# Patient Record
Sex: Male | Born: 1965 | Race: White | Hispanic: No | Marital: Married | Smoking: Never smoker
Health system: Southern US, Community
[De-identification: ages and names within clinical notes are randomized; demographics above are authoritative.]

## PROBLEM LIST (undated history)

## (undated) DIAGNOSIS — N200 Calculus of kidney: Secondary | ICD-10-CM

## (undated) DIAGNOSIS — M5410 Radiculopathy, site unspecified: Secondary | ICD-10-CM

## (undated) DIAGNOSIS — Z87442 Personal history of urinary calculi: Secondary | ICD-10-CM

## (undated) DIAGNOSIS — T8859XA Other complications of anesthesia, initial encounter: Secondary | ICD-10-CM

## (undated) DIAGNOSIS — R519 Headache, unspecified: Secondary | ICD-10-CM

## (undated) HISTORY — PX: LITHOTRIPSY: SUR834

## (undated) HISTORY — DX: Calculus of kidney: N20.0

---

## 2004-11-29 IMAGING — CR DG CHEST 2V
1 series · 2 of 2 positions shown · non-contrast
Comparison: none

REASON FOR EXAM: Chest pain
COMMENTS:

PROCEDURE:     DXR - DXR CHEST PA (OR AP) AND LATERAL  - [DATE]  [DATE]
RESULT:     Comparison is made to a prior study dated [DATE].
The lungs are clear. The cardiac silhouette and visualized bony skeleton are
unremarkable.

[Series 1: view not recorded · 0.17mm/px · 2 of 2 slices shown]
[im 1/2]
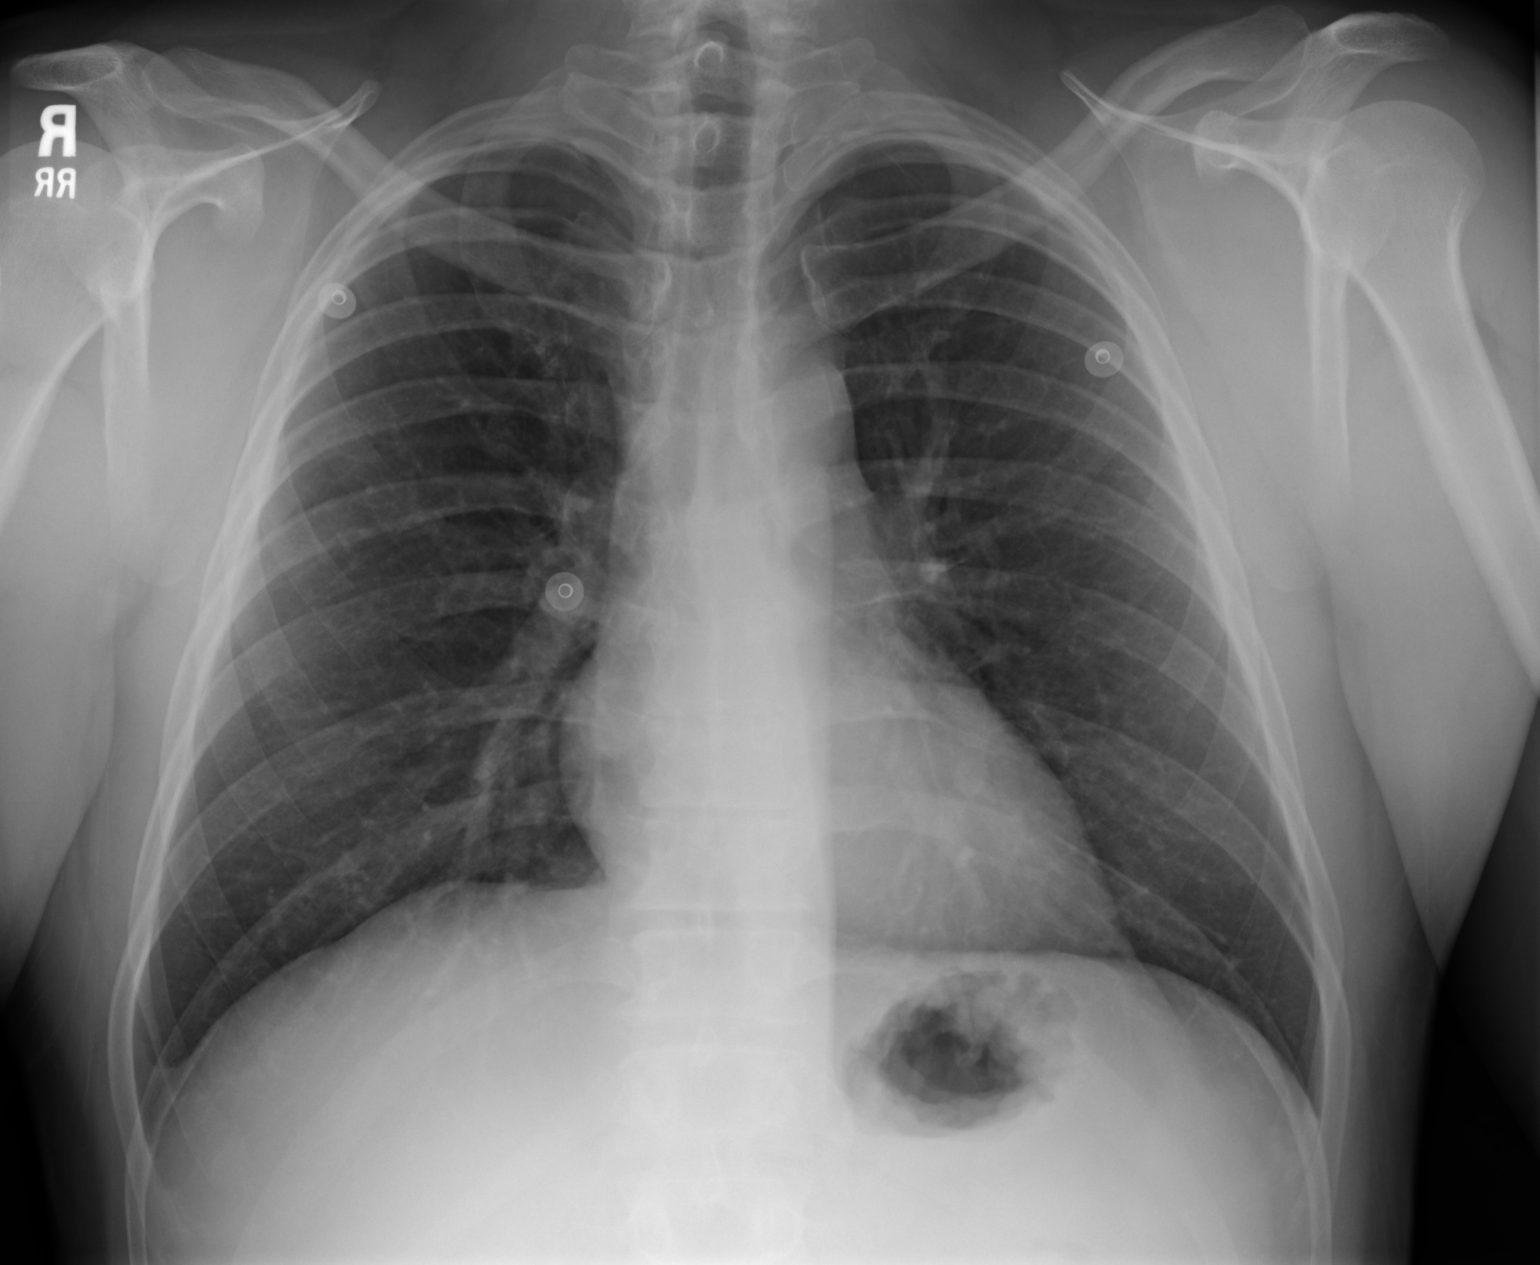
[im 2/2]
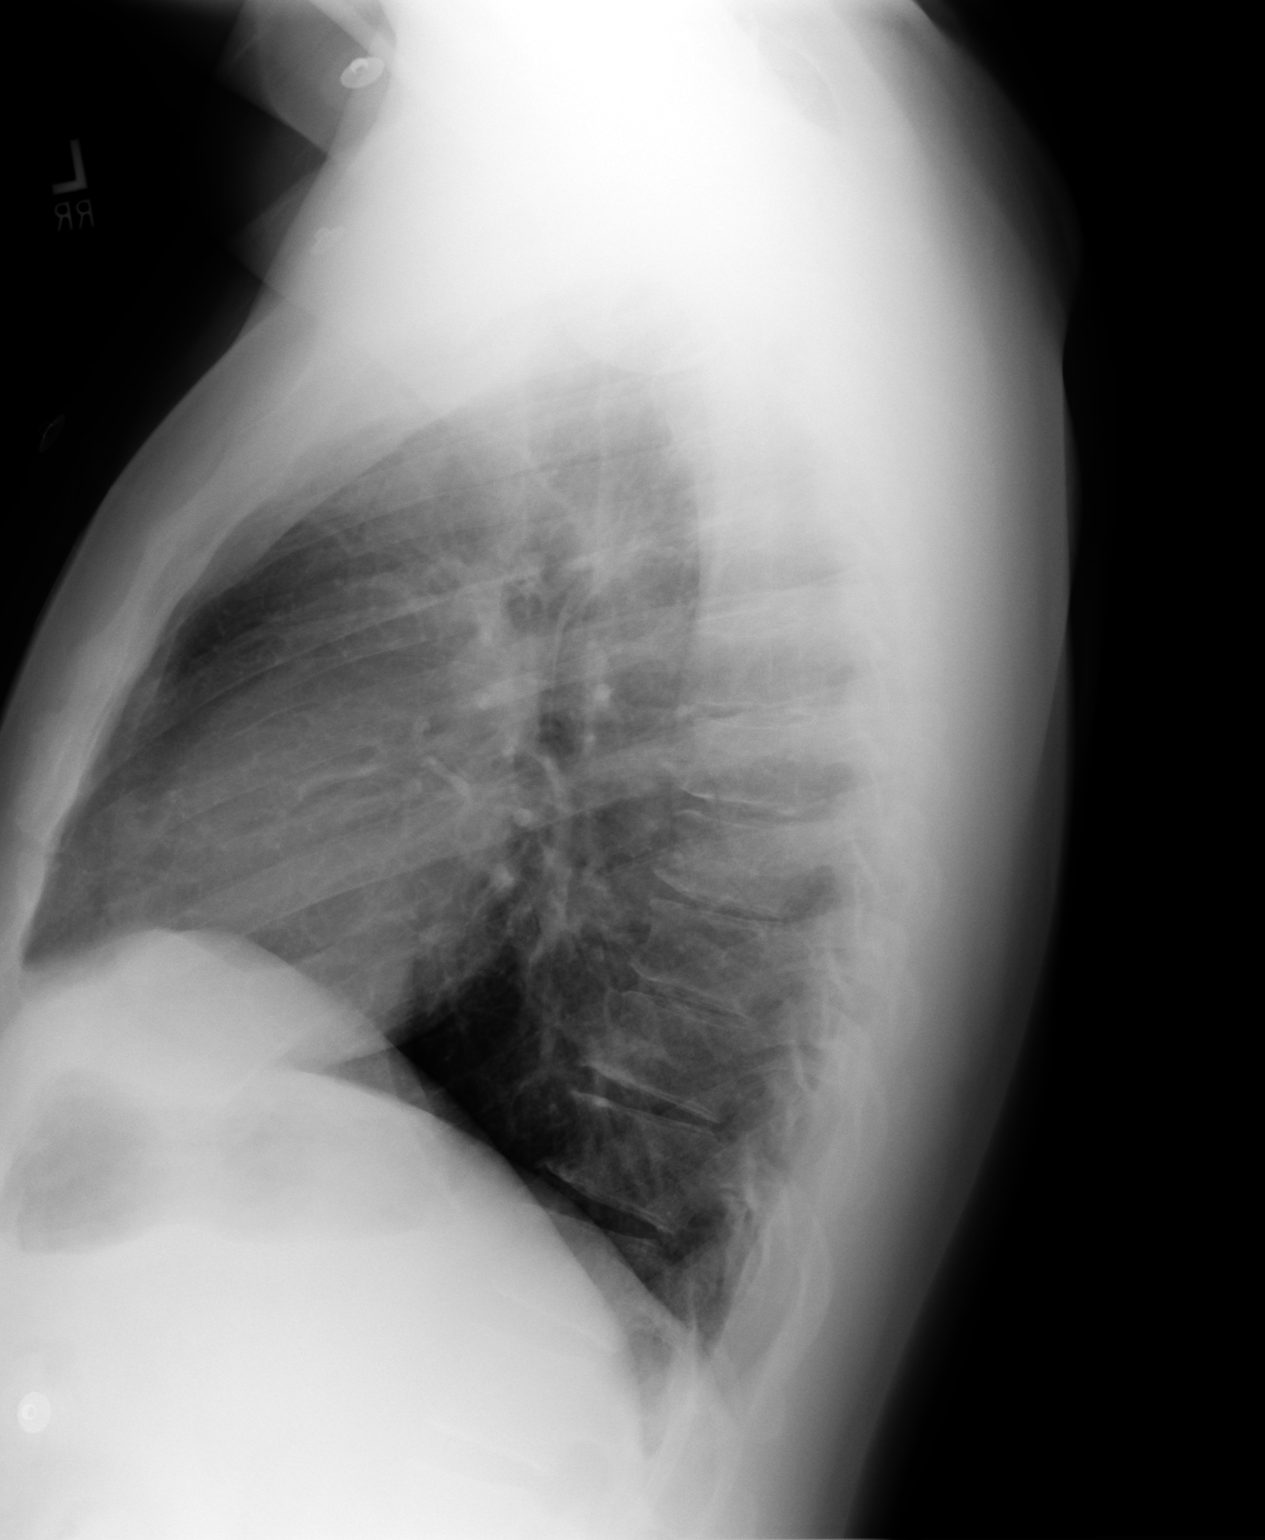

[2 of 2 positions shown; findings below may reference images not displayed]

IMPRESSION: Chest radiograph without evidence of acute cardiopulmonary
disease.

## 2005-10-20 ENCOUNTER — Emergency Department: Payer: Self-pay | Admitting: Emergency Medicine

## 2005-10-20 ENCOUNTER — Other Ambulatory Visit: Payer: Self-pay

## 2005-10-20 IMAGING — CR DG CHEST 1V PORT
1 series · 1 of 1 positions shown · non-contrast
Comparison: none

REASON FOR EXAM: Chest pressure
COMMENTS:

PROCEDURE:     DXR - DXR PORTABLE CHEST SINGLE VIEW  - [DATE]  [DATE]
RESULT:          The lungs are clear.  The cardiac silhouette and visualized
bony skeleton are unremarkable.

[view not recorded]
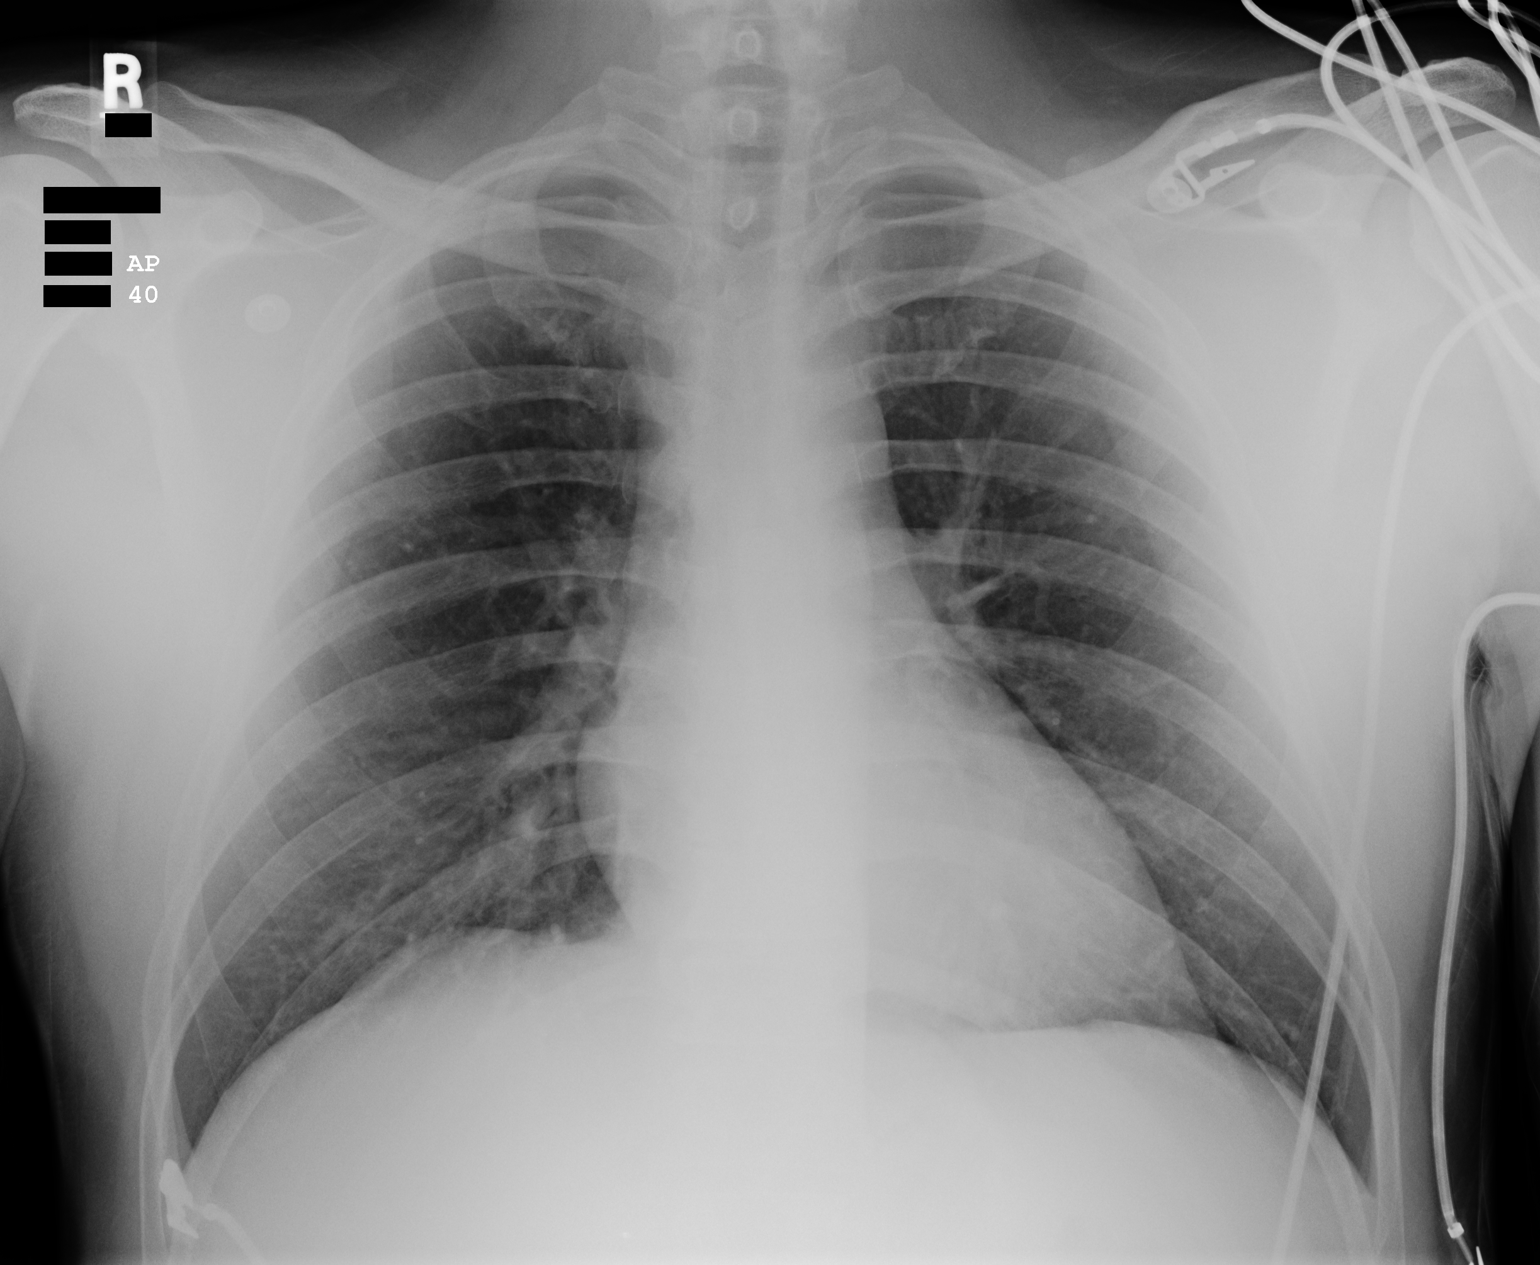

[1 of 1 positions shown; findings below may reference images not displayed]

IMPRESSION: Chest radiograph without evidence of acute
cardiopulmonary disease.

## 2006-03-08 ENCOUNTER — Emergency Department: Payer: Self-pay | Admitting: Emergency Medicine

## 2006-03-08 ENCOUNTER — Other Ambulatory Visit: Payer: Self-pay

## 2006-03-18 ENCOUNTER — Ambulatory Visit: Payer: Self-pay | Admitting: Emergency Medicine

## 2007-02-07 ENCOUNTER — Ambulatory Visit: Payer: Self-pay | Admitting: Gastroenterology

## 2007-02-07 IMAGING — NM NUCLEAR MEDICINE HEPATOHBILIARY INCLUDE GB
5 series · 26 of 26 positions shown · non-contrast
Comparison: none

REASON FOR EXAM: neg us at BI   abd pain
COMMENTS:

[Series 1000: gallbladder dynamic (results) · 4.80mm/px · 6 of 60 frames shown]
[frame 6/60]
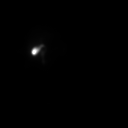
[frame 16/60]
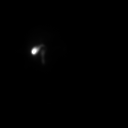
[frame 26/60]
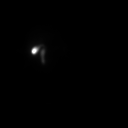
[frame 36/60]
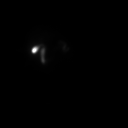
[frame 46/60]
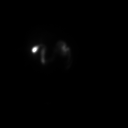
[frame 56/60]
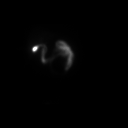

[Series 1000: gallbladder statics (concatenated) · 4.80mm/px · 7 of 7 slices shown]
[im 1/7]
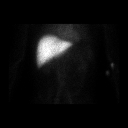
[im 2/7]
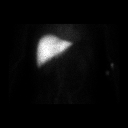
[im 3/7]
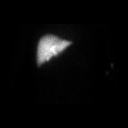
[im 4/7]
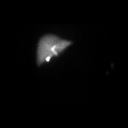
[im 5/7]
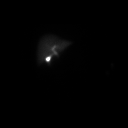
[im 6/7]
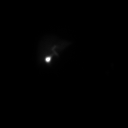
[im 7/7]
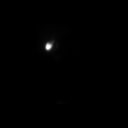

[Series 1000: gallbladder statics · 4.80mm/px · 6 of 6 slices shown (1 of 2)]
[im 1/6]
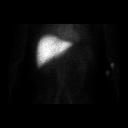
[im 2/6]
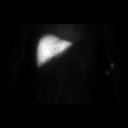
[im 3/6]
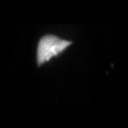
[im 4/6]
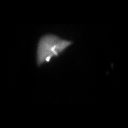
[im 5/6]
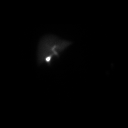
[im 6/6]
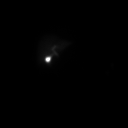

[Series 1000: gallbladder statics · 4.80mm/px · 1 of 1 slices shown (2 of 2)]
[im 1/1]
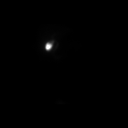

[Series 1000: gallbladder dynamic · 4.80mm/px · 6 of 60 frames shown]
[frame 6/60]
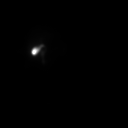
[frame 16/60]
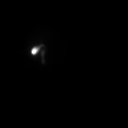
[frame 26/60]
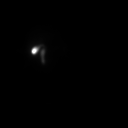
[frame 36/60]
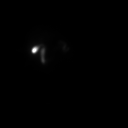
[frame 46/60]
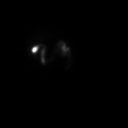
[frame 56/60]
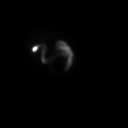

[26 of 26 positions shown; findings below may reference images not displayed]

PROCEDURE:     NM  - NM HEPATO WITH GB EJECT FRACTION  - [DATE]  [DATE]

RESULT:     The patient received 8 mCi of technetium 99m labeled Mebrofenin
for this study. The patient also received 1.4 mcg of sincalide over 30
minutes.

There is adequate uptake of the radiopharmaceutical by the liver. The
intrahepatic ducts and gallbladder are visible by approximately 10 minutes.
Bowel activity is evident by approximately 70 minutes. The 30 minutes
gallbladder ejection fraction is 72% which is normal.
IMPRESSION: 1. Normal hepatobiliary scan.
2. There is normal gallbladder ejection fraction.

## 2007-09-18 ENCOUNTER — Ambulatory Visit: Payer: Self-pay | Admitting: Gastroenterology

## 2009-03-11 ENCOUNTER — Emergency Department: Payer: Self-pay | Admitting: Emergency Medicine

## 2009-03-12 IMAGING — CR DG CHEST 2V
1 series · 2 of 2 positions shown · non-contrast
Comparison: none

REASON FOR EXAM: chest pain
COMMENTS:

PROCEDURE:     DXR - DXR CHEST PA (OR AP) AND LATERAL  - [DATE] [DATE]
RESULT:     Comparison: [DATE]

[Series 1: view not recorded · 0.17mm/px · 2 of 2 slices shown]
[im 1/2]
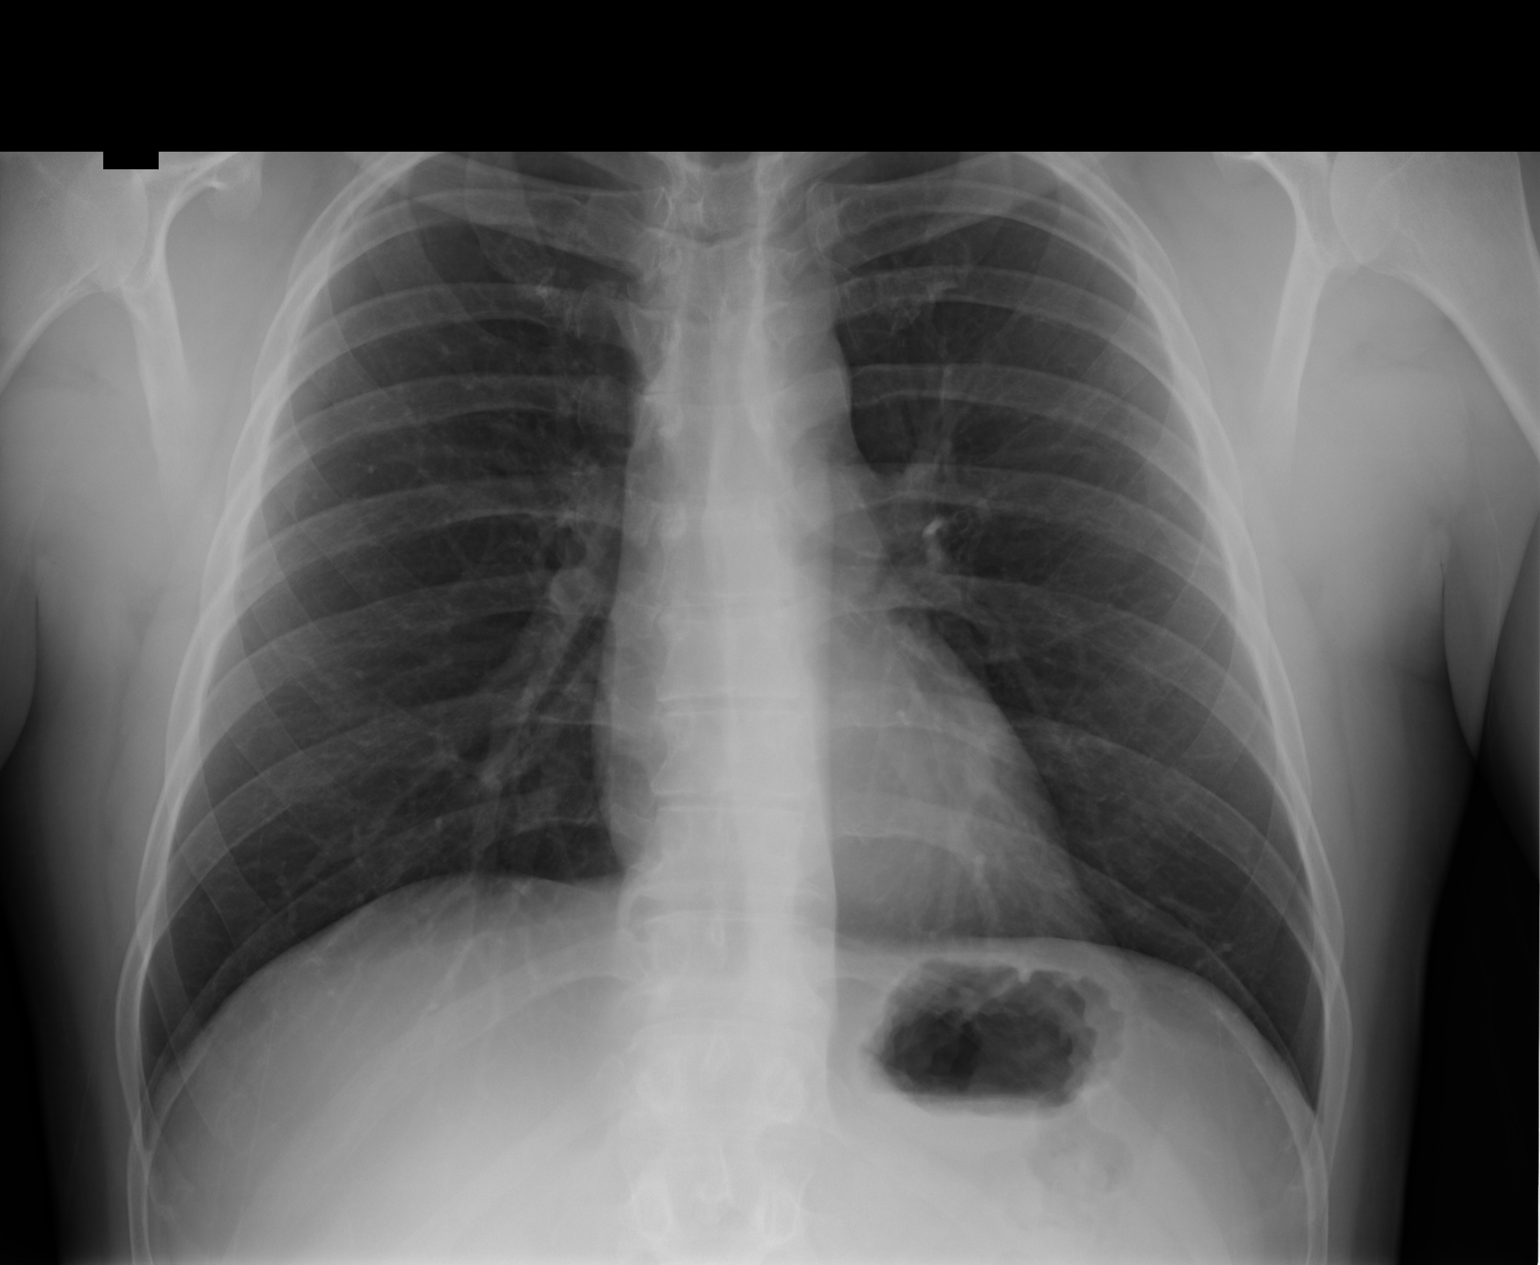
[im 2/2]
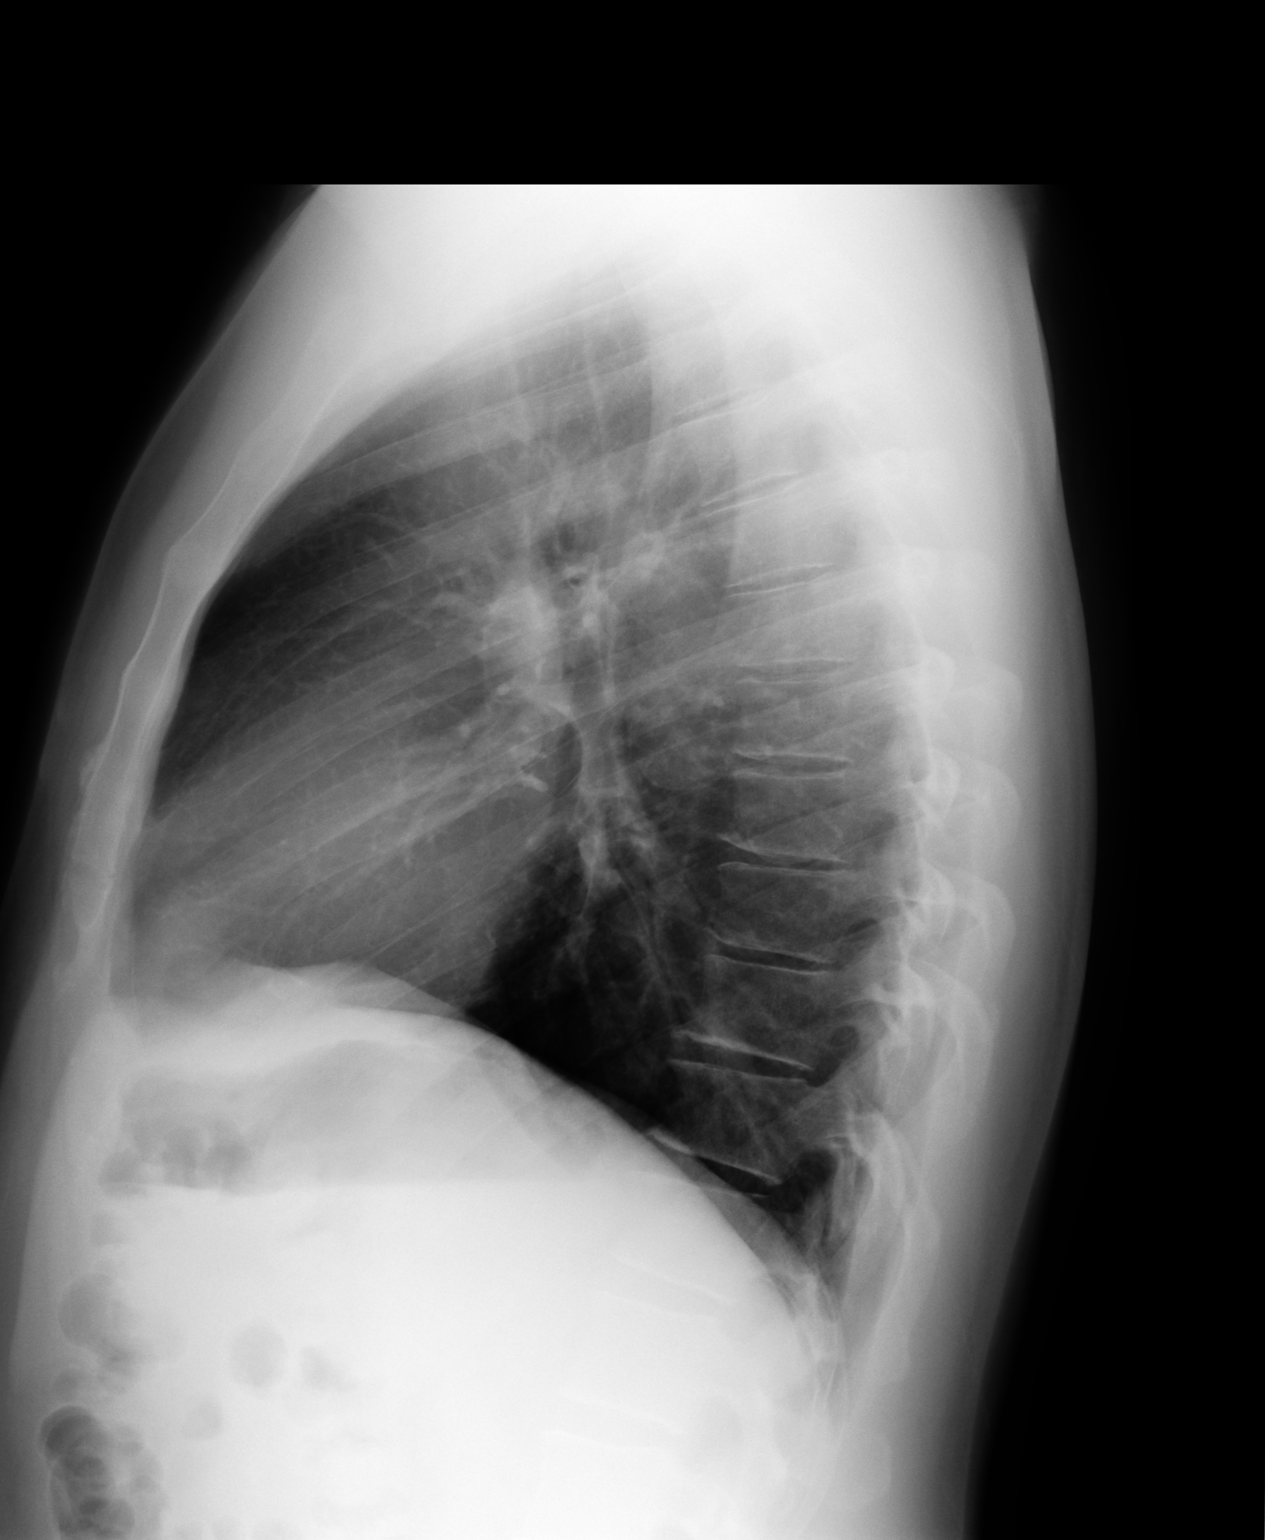

[2 of 2 positions shown; findings below may reference images not displayed]

FINDINGS: PA and lateral chest radiographs are provided. There is no focal parenchymal
opacity, pleural effusion, or pneumothorax. The heart and mediastinum are
unremarkable. The osseous structures are unremarkable.
IMPRESSION: No acute disease of the chest.

## 2011-12-18 ENCOUNTER — Emergency Department: Payer: Self-pay | Admitting: Emergency Medicine

## 2011-12-18 LAB — COMPREHENSIVE METABOLIC PANEL
Albumin: 4 g/dL (ref 3.4–5.0)
BUN: 17 mg/dL (ref 7–18)
Calcium, Total: 8.8 mg/dL (ref 8.5–10.1)
Co2: 28 mmol/L (ref 21–32)
Creatinine: 1.12 mg/dL (ref 0.60–1.30)
Glucose: 87 mg/dL (ref 65–99)
Osmolality: 282 (ref 275–301)
Sodium: 141 mmol/L (ref 136–145)
Total Protein: 7.1 g/dL (ref 6.4–8.2)

## 2011-12-18 LAB — CBC
HCT: 42.6 % (ref 40.0–52.0)
MCH: 31.9 pg (ref 26.0–34.0)
MCHC: 34.7 g/dL (ref 32.0–36.0)
MCV: 92 fL (ref 80–100)
Platelet: 144 10*3/uL — ABNORMAL LOW (ref 150–440)
RBC: 4.64 10*6/uL (ref 4.40–5.90)
RDW: 12.6 % (ref 11.5–14.5)
WBC: 6.3 10*3/uL (ref 3.8–10.6)

## 2011-12-18 IMAGING — CT CT HEAD WITHOUT CONTRAST
2 series · 16 of 30 positions shown, 20 images · non-contrast
Comparison: none

REASON FOR EXAM: leg numbness/dizziness
COMMENTS:

[Series 2: without · axial · non-contrast · 0.41mm/px · z∈[-12,+108]mm · 13 of 30 slices shown, 17 images]
[im 3/30  brain]
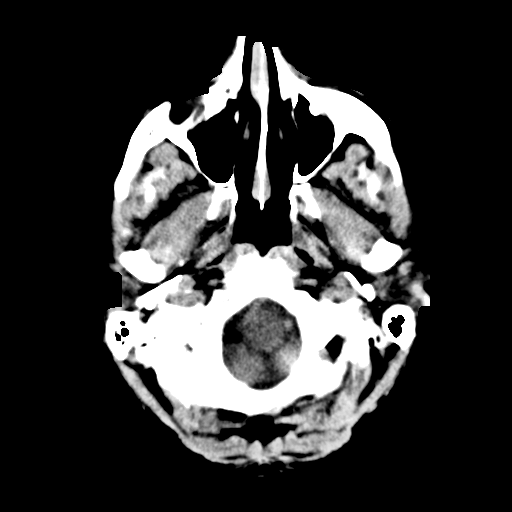
[im 3/30  bone]
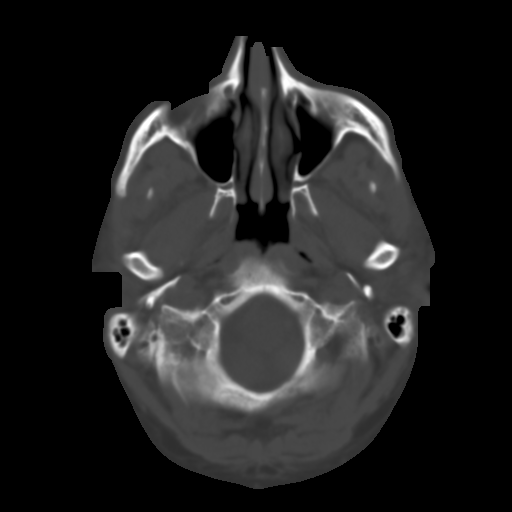
[im 5/30  brain]
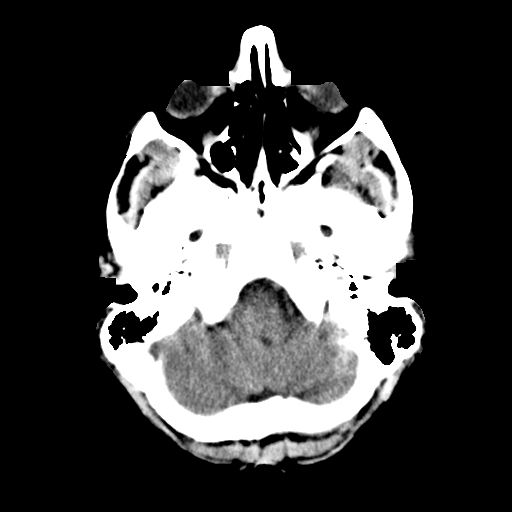
[im 7/30  brain]
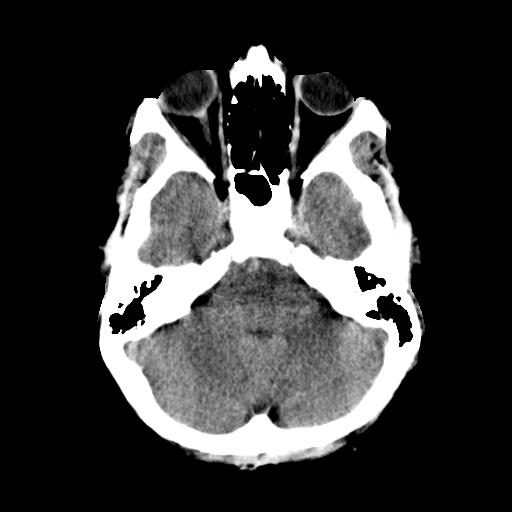
[im 9/30  brain]
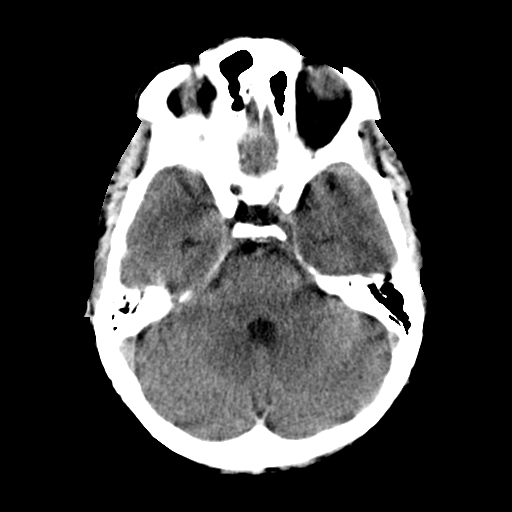
[im 11/30  brain]
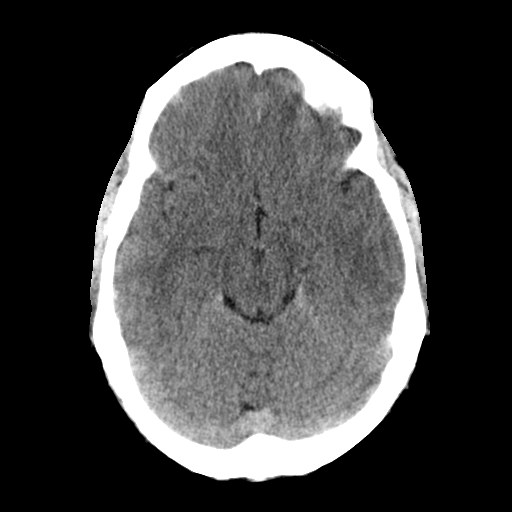
[im 11/30  bone]
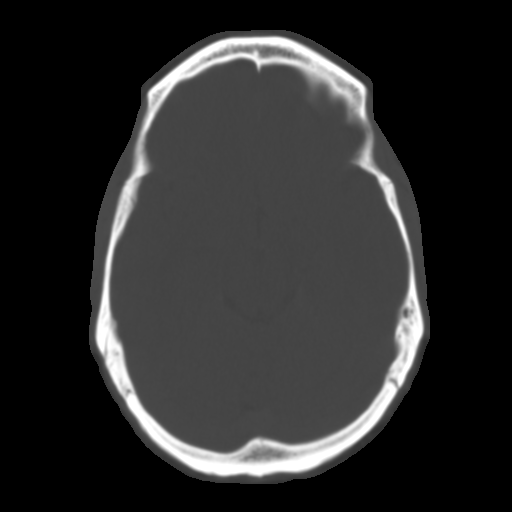
[im 13/30  brain]
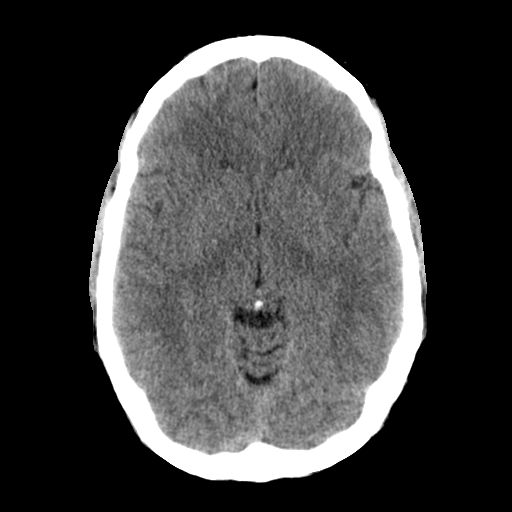
[im 15/30  brain]
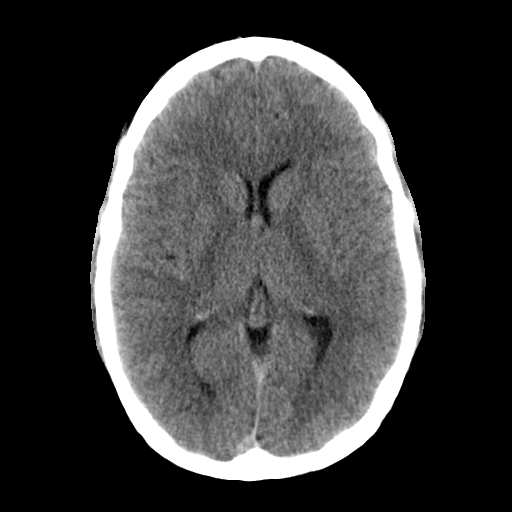
[im 17/30  brain]
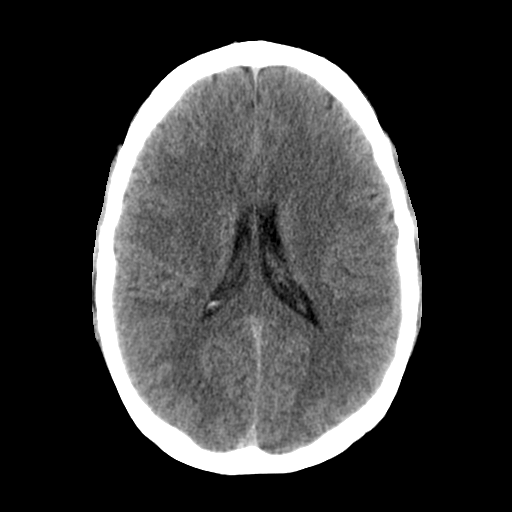
[im 19/30  brain]
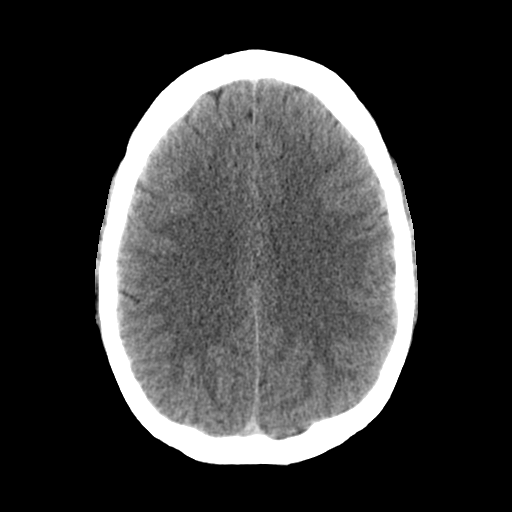
[im 19/30  bone]
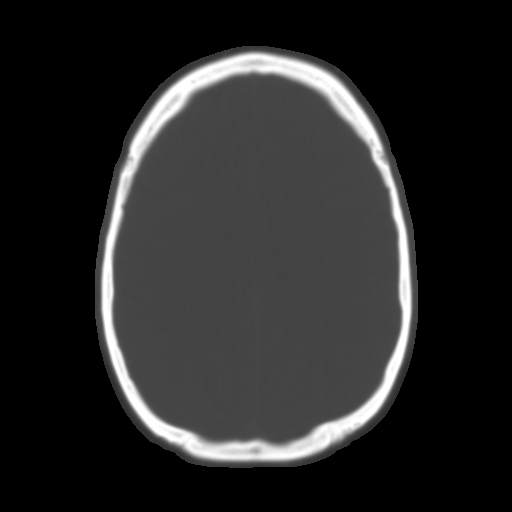
[im 21/30  brain]
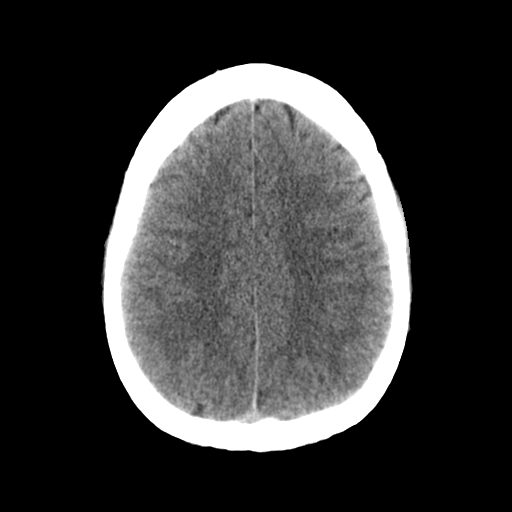
[im 23/30  brain]
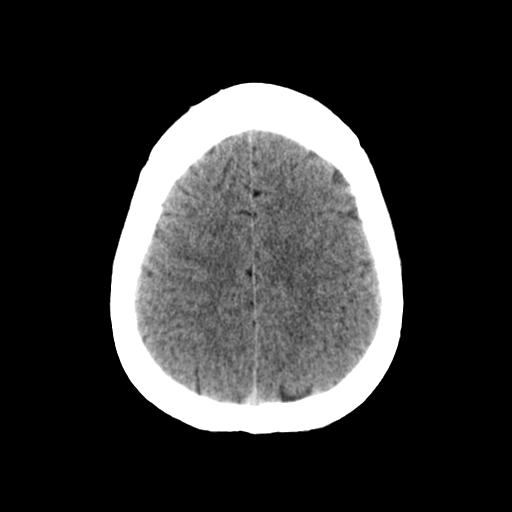
[im 25/30  brain]
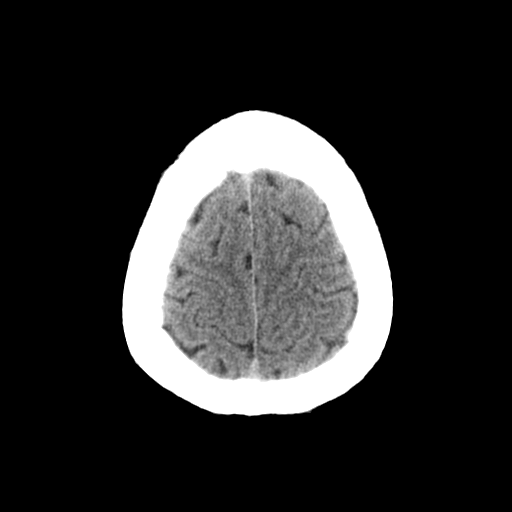
[im 27/30  brain]
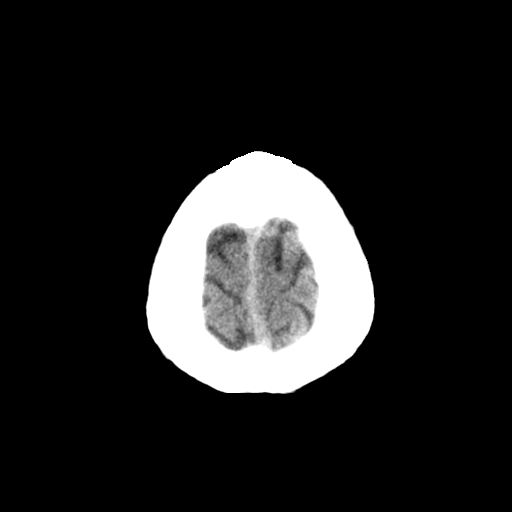
[im 27/30  bone]
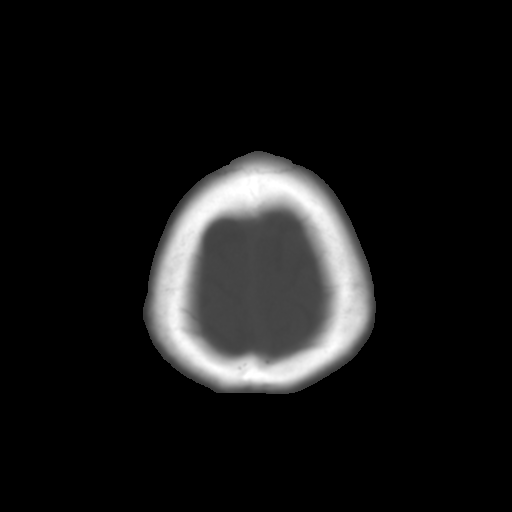

[Series 3: bone · axial · 0.41mm/px · z∈[-12,+28]mm · 3 of 30 slices shown]
[im 3/30  bone]
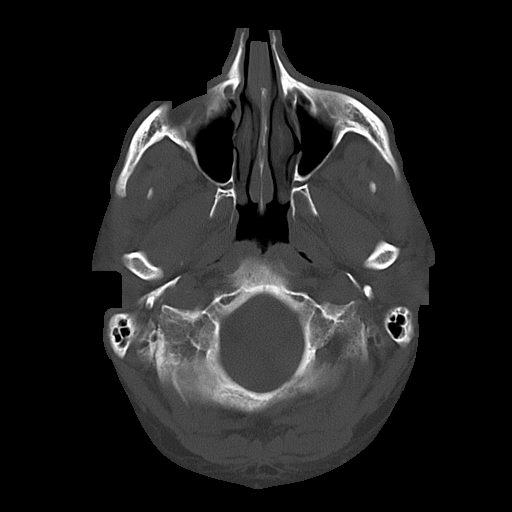
[im 7/30  bone]
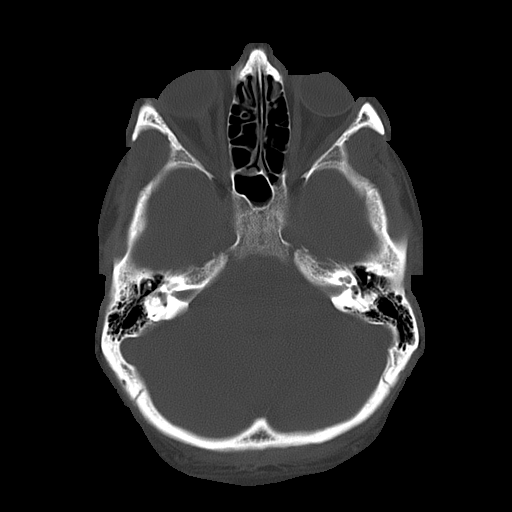
[im 11/30  bone]
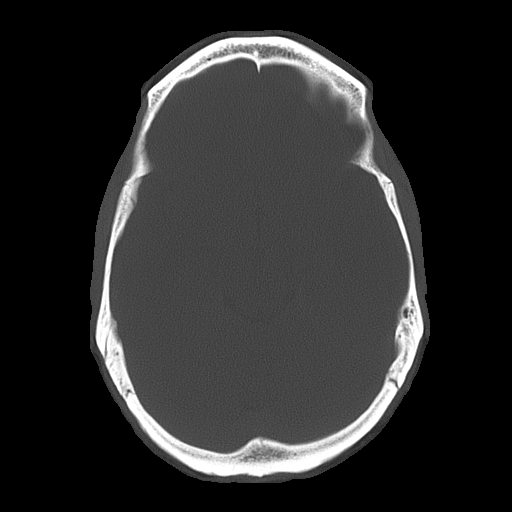

[16 of 30 positions shown; findings below may reference images not displayed]

PROCEDURE:     CT  - CT HEAD WITHOUT CONTRAST  - [DATE] [DATE]

RESULT:     Axial noncontrast CT scanning was performed through the brain
with reconstructions at 5 mm intervals and slice thicknesses.

The ventricles are normal in size and position. There is no intracranial
hemorrhage nor intracranial mass effect. The cerebellum and brainstem are
normal in density. I see no evidence of an evolving ischemic infarction.

At bone window settings the observed portions of the paranasal sinuses and
mastoid air cells are clear.
IMPRESSION: I see no acute intracranial abnormality.

## 2015-02-18 ENCOUNTER — Encounter: Payer: Self-pay | Admitting: Primary Care

## 2015-02-18 ENCOUNTER — Ambulatory Visit (INDEPENDENT_AMBULATORY_CARE_PROVIDER_SITE_OTHER): Payer: Federal, State, Local not specified - PPO | Admitting: Primary Care

## 2015-02-18 VITALS — BP 108/60 | HR 69 | Temp 98.0°F | Ht 67.5 in | Wt 182.0 lb

## 2015-02-18 DIAGNOSIS — Z7189 Other specified counseling: Secondary | ICD-10-CM | POA: Diagnosis not present

## 2015-02-18 DIAGNOSIS — Z7689 Persons encountering health services in other specified circumstances: Secondary | ICD-10-CM

## 2015-02-18 NOTE — Patient Instructions (Signed)
Please schedule a physical with me in the next 2-3 months. You will also schedule a lab only appointment one week prior. We will discuss your lab results during your physical.  It was a pleasure to meet you today! Please don't hesitate to call me with any questions. Welcome to [facility]!    

## 2015-02-18 NOTE — Progress Notes (Signed)
Pre visit review using our clinic review tool, if applicable. No additional management support is needed unless otherwise documented below in the visit note. 

## 2015-02-18 NOTE — Progress Notes (Signed)
   Subjective:    Patient ID: Dustin Todd, male    DOB: Jul 09, 1966, 49 y.o.   MRN: [id number]  HPI  Dustin Todd is a 49 year old male who presents today to establish care and discuss the problems mentioned below. Will obtain old records.  1) Gluten Sensitifity: Consistently experienced abdominal pain, chest pain, increased heart rate 3 years ago. He spoke with a co-worker who had lost weight on Atkins and so he decided to cut out bread. Since he's discontinued bread products (breads, pastas, crackers), he's not had any of those symptoms.    Review of Systems  Constitutional: Negative for unexpected weight change.  HENT: Negative for rhinorrhea.        Recent ear infections, following with ENT in [facility]  Respiratory: Negative for cough and shortness of breath.   Gastrointestinal: Negative for diarrhea and constipation.  Genitourinary: Negative for difficulty urinating.  Musculoskeletal: Negative for joint swelling and arthralgias.  Skin: Negative for rash.  Allergic/Immunologic: Negative for environmental allergies.  Neurological: Negative for dizziness and headaches.       Vertigo occasional       No past medical history on file.  History   Social History  . Marital Status: Married    Spouse Name: N/A  . Number of Children: N/A  . Years of Education: N/A   Occupational History  . Not on file.   Social History Main Topics  . Smoking status: Never Smoker   . Smokeless tobacco: Never Used  . Alcohol Use: 0.0 oz/week    0 Standard drinks or equivalent per week  . Drug Use: No  . Sexual Activity: Not on file   Other Topics Concern  . Not on file   Social History Narrative   Married.   2 children (boy and girl).   Works in [employer].   Enjoys going to [employer], golfing, basketball.    No past surgical history on file.  Family History  Problem Relation Age of Onset  . Cancer Paternal Grandfather     Bone  . Cancer Maternal Grandmother     Breast     Allergies  Allergen Reactions  . Penicillins Rash    No current outpatient prescriptions on file prior to visit.   No current facility-administered medications on file prior to visit.    BP 108/60 mmHg  Pulse 69  Temp(Src) 98 F (36.7 C) (Tympanic)  Ht 5' 7.5" (1.715 m)  Wt 182 lb (82.555 kg)  BMI 28.07 kg/m2  SpO2 99%    Objective:   Physical Exam  Constitutional: He appears well-nourished.  Cardiovascular: Normal rate and regular rhythm.   Pulmonary/Chest: Effort normal and breath sounds normal.  Skin: Skin is warm and dry.          Assessment & Plan:  Schedule physical in the next 2-3 months. Will obtain old records in the mean time. Will discuss healthy diet and importance of exercise during physical. He has no complaints on today's visit.

## 2015-04-07 ENCOUNTER — Other Ambulatory Visit: Payer: Self-pay | Admitting: Internal Medicine

## 2015-04-07 DIAGNOSIS — Z0000 Encounter for general adult medical examination without abnormal findings: Secondary | ICD-10-CM

## 2015-04-14 ENCOUNTER — Telehealth: Payer: Self-pay | Admitting: Primary Care

## 2015-04-14 ENCOUNTER — Other Ambulatory Visit: Payer: Self-pay | Admitting: Primary Care

## 2015-04-14 ENCOUNTER — Other Ambulatory Visit (INDEPENDENT_AMBULATORY_CARE_PROVIDER_SITE_OTHER): Payer: Federal, State, Local not specified - PPO

## 2015-04-14 DIAGNOSIS — Z0000 Encounter for general adult medical examination without abnormal findings: Secondary | ICD-10-CM

## 2015-04-14 DIAGNOSIS — Z3009 Encounter for other general counseling and advice on contraception: Secondary | ICD-10-CM

## 2015-04-14 DIAGNOSIS — R7989 Other specified abnormal findings of blood chemistry: Secondary | ICD-10-CM | POA: Diagnosis not present

## 2015-04-14 LAB — HEMOGLOBIN A1C: HEMOGLOBIN A1C: 5 % (ref 4.6–6.5)

## 2015-04-14 LAB — LIPID PANEL
CHOL/HDL RATIO: 6
Cholesterol: 258 mg/dL — ABNORMAL HIGH (ref 0–200)
HDL: 45.8 mg/dL (ref >39.00)
NONHDL: 212.35
TRIGLYCERIDES: 372 mg/dL — AB (ref 0.0–149.0)
VLDL: 74.4 mg/dL — ABNORMAL HIGH (ref 0.0–40.0)

## 2015-04-14 LAB — CBC
HCT: 46.3 % (ref 39.0–52.0)
Hemoglobin: 15.9 g/dL (ref 13.0–17.0)
MCHC: 34.3 g/dL (ref 30.0–36.0)
MCV: 92.4 fl (ref 78.0–100.0)
Platelets: 158 10*3/uL (ref 150.0–400.0)
RBC: 5.01 Mil/uL (ref 4.22–5.81)
RDW: 12.9 % (ref 11.5–15.5)
WBC: 6.1 10*3/uL (ref 4.0–10.5)

## 2015-04-14 LAB — COMPREHENSIVE METABOLIC PANEL
ALT: 22 U/L (ref 0–53)
AST: 22 U/L (ref 0–37)
Albumin: 4.2 g/dL (ref 3.5–5.2)
Alkaline Phosphatase: 37 U/L — ABNORMAL LOW (ref 39–117)
BUN: 12 mg/dL (ref 6–23)
CHLORIDE: 102 meq/L (ref 96–112)
CO2: 29 meq/L (ref 19–32)
CREATININE: 1.01 mg/dL (ref 0.40–1.50)
Calcium: 9.5 mg/dL (ref 8.4–10.5)
GFR: 84.22 mL/min (ref >60.00)
GLUCOSE: 91 mg/dL (ref 70–99)
Potassium: 4.6 mEq/L (ref 3.5–5.1)
SODIUM: 138 meq/L (ref 135–145)
Total Bilirubin: 0.6 mg/dL (ref 0.2–1.2)
Total Protein: 7.2 g/dL (ref 6.0–8.3)

## 2015-04-14 LAB — LDL CHOLESTEROL, DIRECT: Direct LDL: 149 mg/dL

## 2015-04-14 NOTE — Telephone Encounter (Signed)
Referral placed.

## 2015-04-14 NOTE — Telephone Encounter (Signed)
Pt called wanting to get a referral to get a vasectomy

## 2015-04-21 ENCOUNTER — Encounter: Payer: Self-pay | Admitting: Primary Care

## 2015-04-21 ENCOUNTER — Ambulatory Visit (INDEPENDENT_AMBULATORY_CARE_PROVIDER_SITE_OTHER): Payer: Federal, State, Local not specified - PPO | Admitting: Primary Care

## 2015-04-21 VITALS — BP 110/62 | HR 54 | Temp 97.6°F | Ht 68.0 in | Wt 183.1 lb

## 2015-04-21 DIAGNOSIS — G44009 Cluster headache syndrome, unspecified, not intractable: Secondary | ICD-10-CM

## 2015-04-21 DIAGNOSIS — E785 Hyperlipidemia, unspecified: Secondary | ICD-10-CM | POA: Diagnosis not present

## 2015-04-21 DIAGNOSIS — Z0000 Encounter for general adult medical examination without abnormal findings: Secondary | ICD-10-CM | POA: Diagnosis not present

## 2015-04-21 NOTE — Progress Notes (Signed)
Subjective:    Patient ID: Dustin Todd, male    DOB: 02/08/1966, 49 y.o.   MRN: [id number]  HPI  Dustin Todd is a 49 year old male who presents today for complete physical.  Immunizations: -Tetanus: Believes he's completed within the last 5 years. -Influenza: Does not get, declines today.  Diet: Endorses a poor diet. Breakfast: Protein drinks, or eggs and bacon at fast food. Lunch: Burger, fries, fast food. 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 Desserts: Occasional ice cream Snack: Potato chips Beverages: Water, gatorade, milk, sweet tea Exercise: He does not exercise, but is active at work. Eye exam: Completed 2 years ago, no changes in vision. Dental exam: Completed in March    Review of Systems  Constitutional: Negative for unexpected weight change.  HENT: Negative for rhinorrhea.   Respiratory: Negative for cough and shortness of breath.   Cardiovascular: Negative for chest pain.  Gastrointestinal: Negative for diarrhea and constipation.  Genitourinary: Negative for difficulty urinating.  Musculoskeletal: Negative for myalgias and arthralgias.  Skin: Negative for rash.  Neurological: Positive for headaches. Negative for dizziness and numbness.       Some headaches, once monthly  Psychiatric/Behavioral: Negative for self-injury.       Denies concerns for anxiety or depression   1) Cluster Headaches: Present monthly for the past 3 months and are located behind right eye. He will experience photophobia with nausea on occasion. He will take ibuprofen with relief. Denies dizziness and changes in vision.     No past medical history on file.  Social History   Social History  . Marital Status: Married    Spouse Name: N/A  . Number of Children: N/A  . Years of Education: N/A   Occupational History  . Not on file.   Social History Main Topics  . Smoking status: Never Smoker   . Smokeless tobacco: Never Used  .  Alcohol Use: 0.0 oz/week    0 Standard drinks or equivalent per week  . Drug Use: No  . Sexual Activity: Not on file   Other Topics Concern  . Not on file   Social History Narrative   Married.   2 children (boy and girl).   Works in [employer] representative.   Enjoys going to [employer], golfing, basketball.    No past surgical history on file.  Family History  Problem Relation Age of Onset  . Cancer Paternal Grandfather     Bone  . Cancer Maternal Grandmother     Breast    Allergies  Allergen Reactions  . Penicillins Rash    No current outpatient prescriptions on file prior to visit.   No current facility-administered medications on file prior to visit.    BP 110/62 mmHg  Pulse 54  Temp(Src) 97.6 F (36.4 C) (Oral)  Ht  (1.727 m)  Wt 183 lb 1.9 oz (83.063 kg)  BMI 27.85 kg/m2  SpO2 99%    Objective:   Physical Exam  Constitutional: He is oriented to person, place, and time. He appears well-nourished.  HENT:  Right Ear: Tympanic membrane and ear canal normal.  Left Ear: Tympanic membrane and ear canal normal.  Nose: Nose normal.  Mouth/Throat: Oropharynx is clear and moist.  Eyes: Conjunctivae and EOM are normal. Pupils are equal, round, and reactive to light.  Neck: Neck supple.  Cardiovascular: Normal rate and regular rhythm.   Pulmonary/Chest: Effort normal and breath sounds normal.  Abdominal: Soft. Bowel sounds are  normal. There is no tenderness.  Musculoskeletal: Normal range of motion.  Lymphadenopathy:    He has no cervical adenopathy.  Neurological: He is alert and oriented to person, place, and time. He has normal reflexes. No cranial nerve deficit.  Skin: Skin is warm and dry.  Psychiatric: He has a normal mood and affect.          Assessment & Plan:

## 2015-04-21 NOTE — Patient Instructions (Addendum)
Your cholesterol and triglycerides are elevated. You need to work on improving your diet and exercising.  Start taking Fish Oil 2000 mg daily. This may be purchased over the counter.  Limit: Red meat, bacon, fried foods, fast food, potato chips.  Increase: Vegetables, fruits, baked lean meats (chicken, fish, etc.).  Start exercising. You need 1 hour of moderate intensity exercise 5 days a week.  Schedule a lab only appointment in 3 months. Make sure you are fasting for 4 hours. You may have water and black coffee only.  Follow up in 1 year for repeat physical.  It was a pleasure to see you today!

## 2015-04-21 NOTE — Assessment & Plan Note (Signed)
Tetanus UTD, declines flu. Exam unremarkable, labs with hyperlipidemia and elevated trigs. Discussed the importance of healthy diet and exercise. Recheck lipids in 3 months. Follow up for repeat physical in 1 year.

## 2015-04-21 NOTE — Progress Notes (Signed)
Pre visit review using our clinic review tool, if applicable. No additional management support is needed unless otherwise documented below in the visit note. 

## 2015-04-21 NOTE — Assessment & Plan Note (Signed)
Occur once monthly for the past 3 months. Located behind right eye. + photophobia and nausea on occasion. Relief with ibuprofen. Will continue to monitor.

## 2015-04-21 NOTE — Assessment & Plan Note (Signed)
Elevation of TC, trigs, and LDL. Poor diet and does not exercise. Discussed the importance of both and recommendations provided. Will recheck lipids in 3 months, if no improvement then will consider stating. Start fish oil daily.

## 2015-05-30 ENCOUNTER — Telehealth: Payer: Self-pay

## 2015-05-30 NOTE — Telephone Encounter (Signed)
Dustin Todd from [facility] med records left v/m requesting PSA results for referral; advised do not have PSA lab result;voiced understanding.

## 2015-06-14 ENCOUNTER — Emergency Department: Payer: Federal, State, Local not specified - PPO

## 2015-06-14 ENCOUNTER — Emergency Department
Admission: EM | Admit: 2015-06-14 | Discharge: 2015-06-14 | Disposition: A | Discharge status: Home / Self Care | Payer: Federal, State, Local not specified - PPO | Attending: Emergency Medicine | Admitting: Emergency Medicine

## 2015-06-14 ENCOUNTER — Encounter: Payer: Self-pay | Admitting: *Deleted

## 2015-06-14 DIAGNOSIS — Y92310 Basketball court as the place of occurrence of the external cause: Secondary | ICD-10-CM | POA: Insufficient documentation

## 2015-06-14 DIAGNOSIS — Z880 Allergy status to penicillin: Secondary | ICD-10-CM | POA: Diagnosis not present

## 2015-06-14 DIAGNOSIS — S299XXA Unspecified injury of thorax, initial encounter: Secondary | ICD-10-CM | POA: Diagnosis present

## 2015-06-14 DIAGNOSIS — W1839XA Other fall on same level, initial encounter: Secondary | ICD-10-CM | POA: Insufficient documentation

## 2015-06-14 DIAGNOSIS — Y998 Other external cause status: Secondary | ICD-10-CM | POA: Diagnosis not present

## 2015-06-14 DIAGNOSIS — Y9367 Activity, basketball: Secondary | ICD-10-CM | POA: Diagnosis not present

## 2015-06-14 DIAGNOSIS — S2232XA Fracture of one rib, left side, initial encounter for closed fracture: Secondary | ICD-10-CM | POA: Insufficient documentation

## 2015-06-14 IMAGING — CR DG THORACIC SPINE 2V
1 series · 3 of 3 positions shown · non-contrast
Comparison: Chest radiograph performed [DATE]

CLINICAL DATA: Landed on back while playing basketball, with
left-sided mid back pain. Initial encounter.

EXAM:
THORACIC SPINE 2 VIEWS

[Series 1: w thoracic spine ap · 0.14mm/px · 3 of 3 slices shown]
[im 1/3]
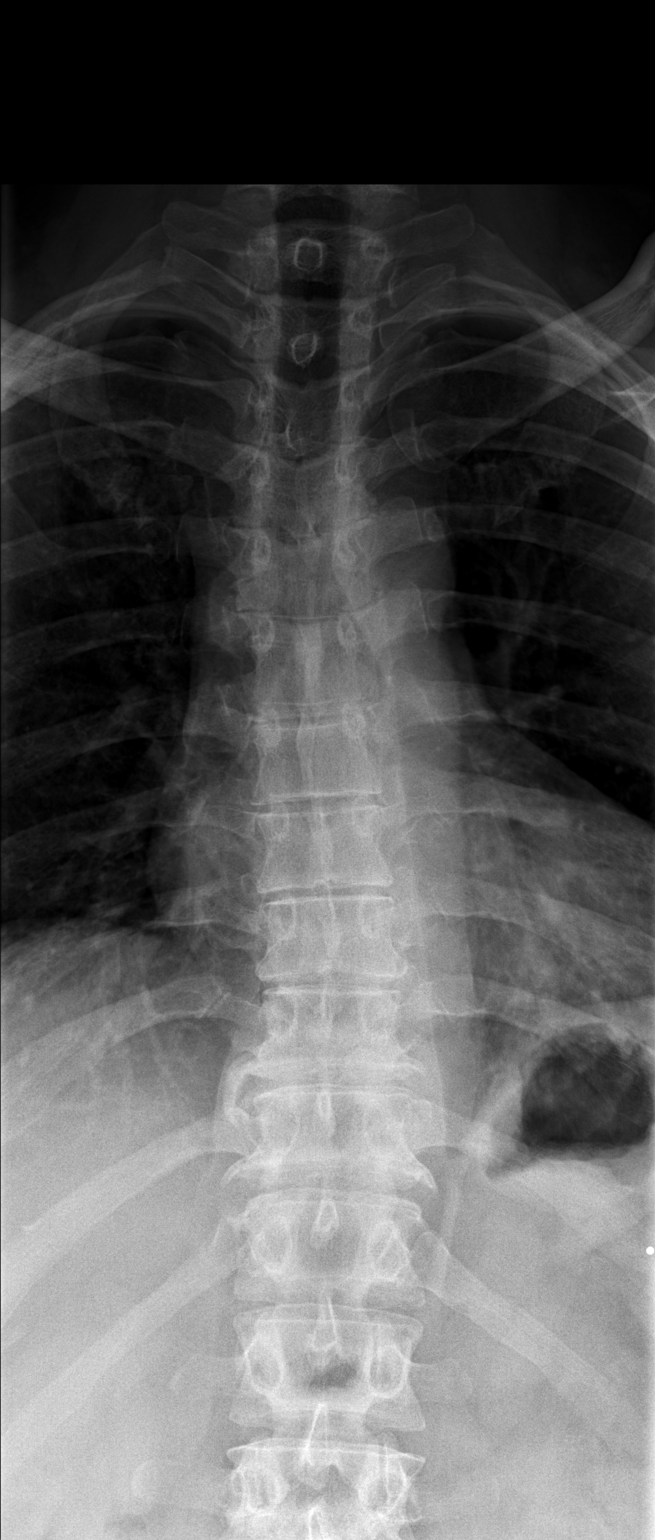
[im 2/3]
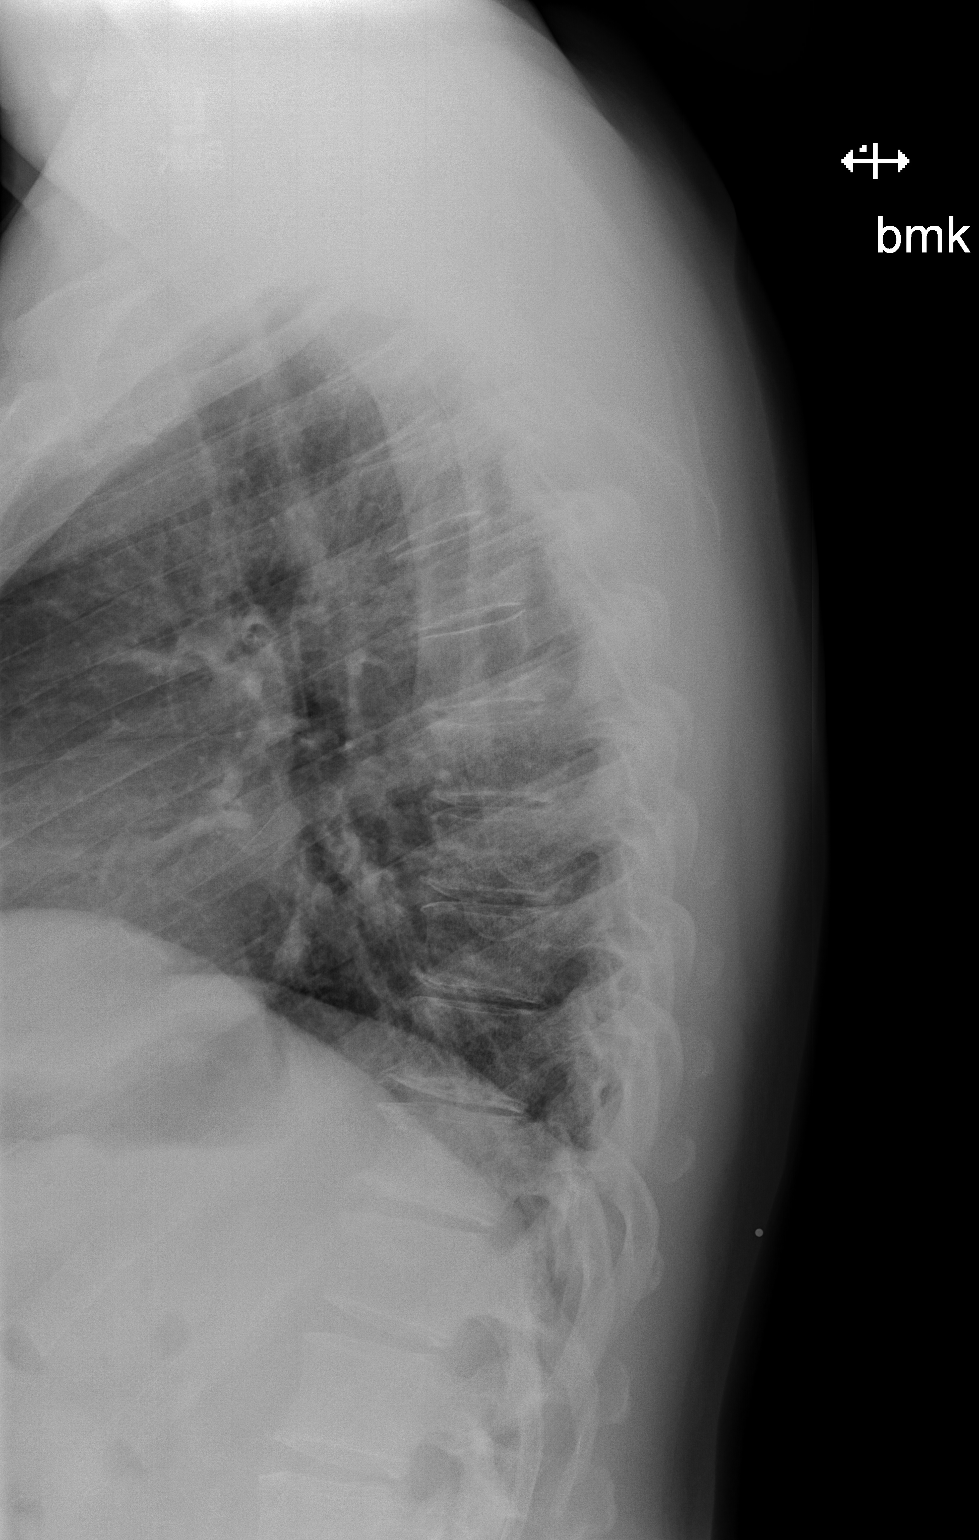
[im 3/3]
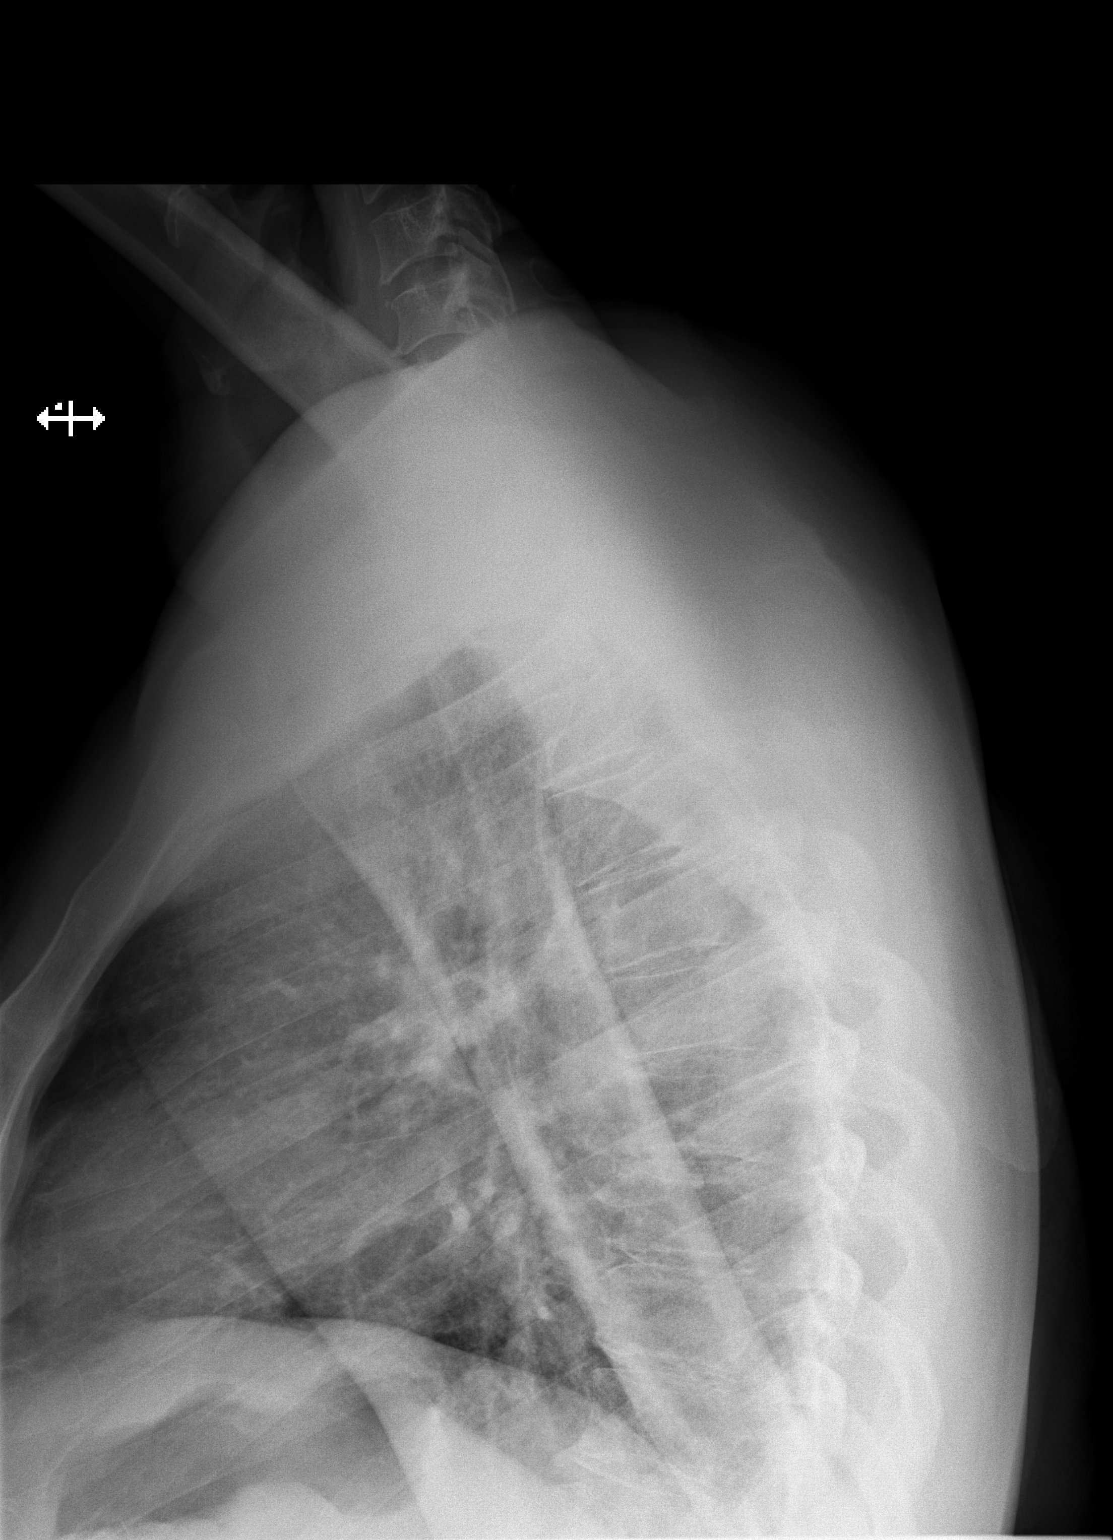

[3 of 3 positions shown; findings below may reference images not displayed]

FINDINGS: There is no evidence of fracture or subluxation. Vertebral bodies
demonstrate normal height and alignment. Intervertebral disc spaces
are preserved. A few lateral osteophytes are noted along the lower
thoracic spine.

The visualized portions of both lungs are clear. The mediastinum is
unremarkable in appearance.
IMPRESSION: No evidence of fracture or subluxation along the thoracic spine.

## 2015-06-14 IMAGING — CR DG RIBS W/ CHEST 3+V*L*
1 series · 5 of 5 positions shown · non-contrast
Comparison: [DATE]

CLINICAL DATA: Back pain, fall playing basketball this afternoon,
left rib pain

EXAM:
LEFT RIBS AND CHEST - 3+ VIEW

[Series 1: w chest pa · 0.14mm/px · 5 of 5 slices shown]
[im 1/5]
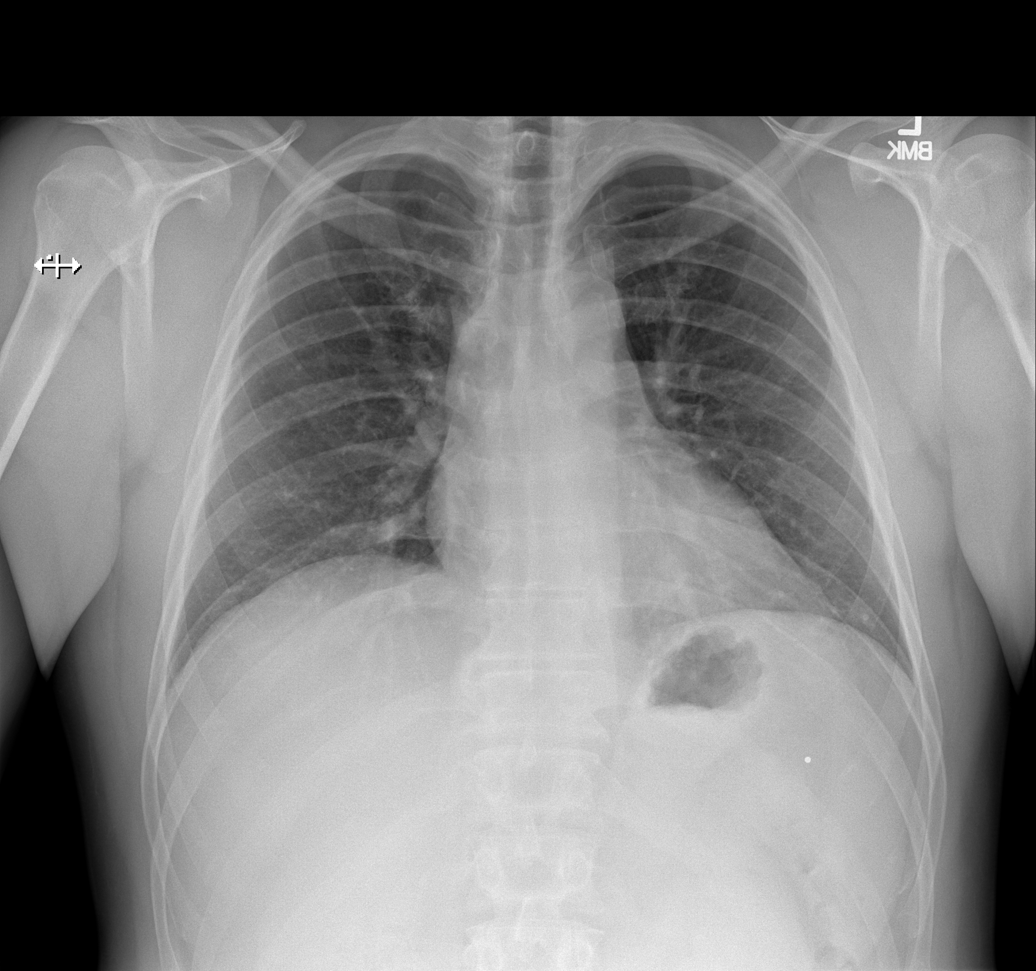
[im 2/5]
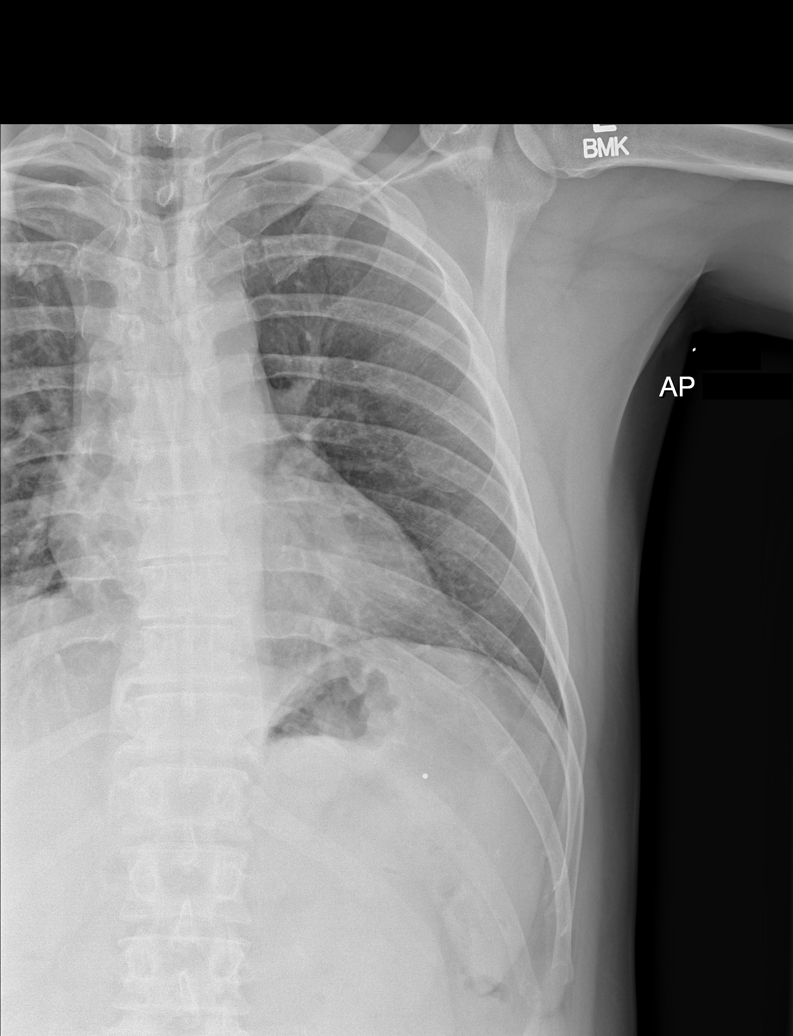
[im 3/5]
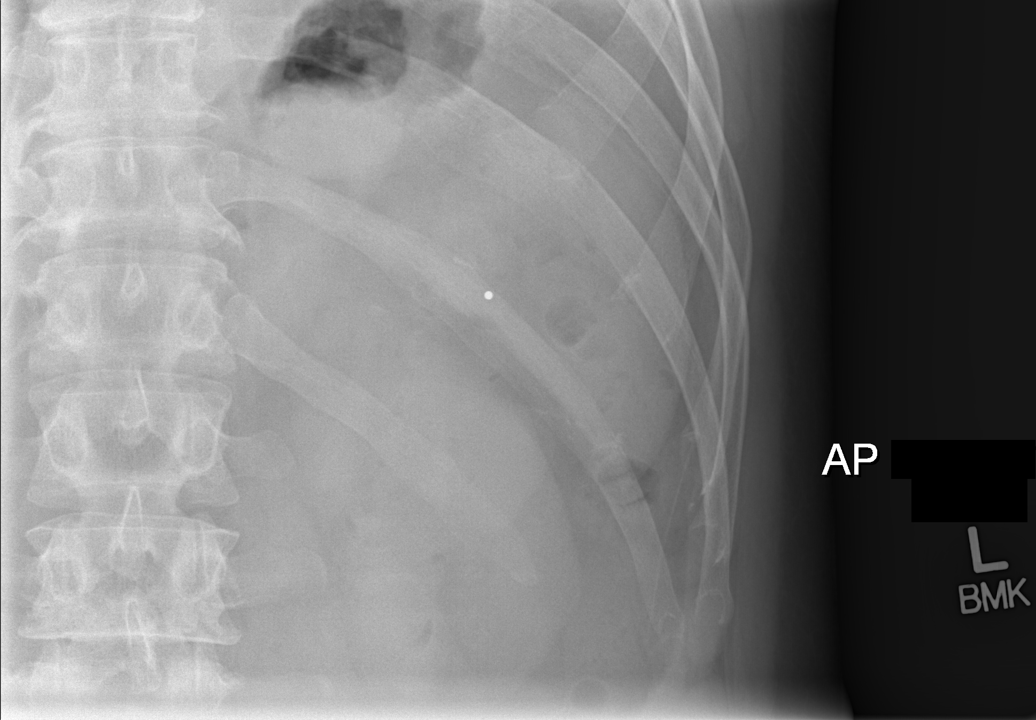
[im 4/5]
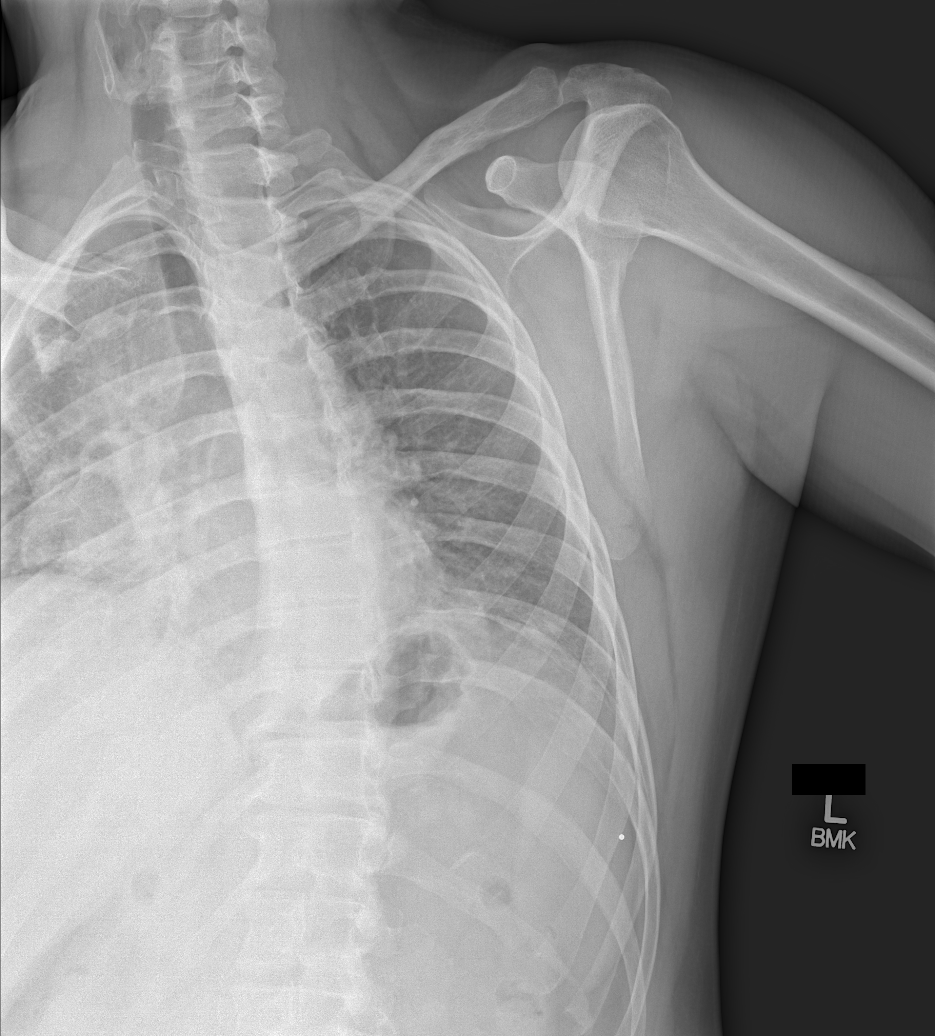
[im 5/5]
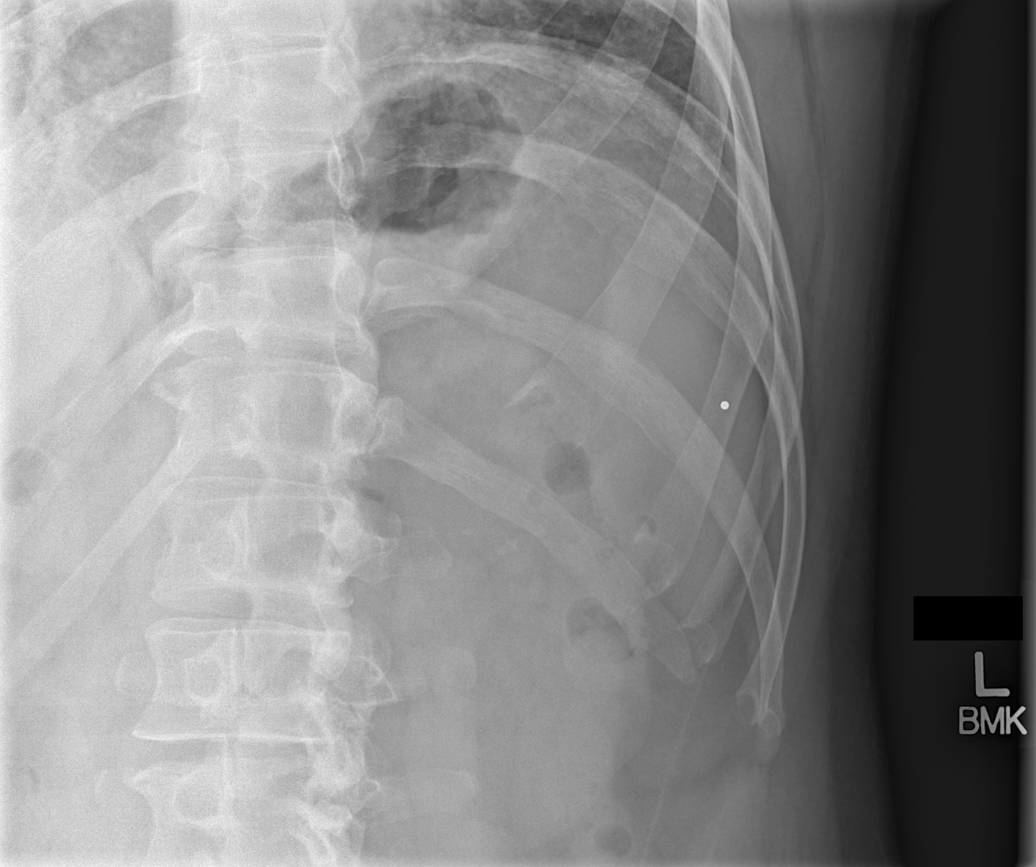

[5 of 5 positions shown; findings below may reference images not displayed]

FINDINGS: Five views left ribs submitted. There is nondisplaced fracture of
the left tenth rib. No pneumothorax. No acute infiltrate or
pulmonary edema.
IMPRESSION: Nondisplaced fracture of the left tenth rib.  No pneumothorax.

## 2015-06-14 MED ORDER — OXYCODONE-ACETAMINOPHEN 5-325 MG PO TABS
1.0000 | ORAL_TABLET | Freq: Once | ORAL | Status: AC
Start: 2015-06-14 — End: 2015-06-14
  Administered 2015-06-14: 1 via ORAL
  Filled 2015-06-14: qty 1

## 2015-06-14 MED ORDER — OXYCODONE-ACETAMINOPHEN 5-325 MG PO TABS: 1.0000 | ORAL_TABLET | Freq: Four times a day (QID) | ORAL | Status: DC | PRN

## 2015-06-14 NOTE — ED Notes (Signed)
Pt dc home ambulatory pain unchanged instructed on follow up plan and med use PT NAD AT DC 

## 2015-06-14 NOTE — ED Provider Notes (Signed)
[facility] Emergency Department Provider Note  ____________________________________________  Time seen: Approximately 7:04 PM  I have reviewed the triage vital signs and the nursing notes.   HISTORY  Chief Complaint Back Pain    HPI Dustin Todd is a 49 y.o. male who presents to the emergency department complaining of back and left-sided rib pain. He states that he was playing basketball when he went up for a dunk, was holding onto the room, when somebody undercut him. He fell landing on thoracic spine and left-sided ribs. He is now endorsing sharp pain that is constant, worse with deep inspiration and movement. He states that pain is midline running to lateral ribs. He states that he is not short of breath but endorses breathing shallowly due to pain. He did not hit his head. He did not lose consciousness. He is not endorsing any numbness or tingling in extremities. He denies any other injury or complaint.   No past medical history on file.  Patient Active Problem List   Diagnosis Date Noted  . Cluster headache 04/21/2015  . Preventative health care 04/21/2015  . Hyperlipidemia 04/21/2015    No past surgical history on file.  Current Outpatient Rx  Name  Route  Sig  Dispense  Refill  . oxyCODONE-acetaminophen (ROXICET) 5-325 MG tablet   Oral   Take 1 tablet by mouth every 6 (six) hours as needed for severe pain.   20 tablet   0     Allergies Penicillins  Family History  Problem Relation Age of Onset  . Cancer Paternal Grandfather     Bone  . Cancer Maternal Grandmother     Breast    Social History Social History  Substance Use Topics  . Smoking status: Never Smoker   . Smokeless tobacco: Never Used  . Alcohol Use: 0.0 oz/week    0 Standard drinks or equivalent per week    Review of Systems Constitutional: No fever/chills Eyes: No visual changes. ENT: No sore throat. Cardiovascular: Denies chest pain. Respiratory: Denies  shortness of breath. Gastrointestinal: No abdominal pain.  No nausea, no vomiting.  No diarrhea.  No constipation. Genitourinary: Negative for dysuria. Musculoskeletal: Endorses thoracic back pain. Endorses left lateral rib pain. Skin: Negative for rash. Neurological: Negative for headaches, focal weakness or numbness.  10-point ROS otherwise negative.  ____________________________________________   PHYSICAL EXAM:  VITAL SIGNS: ED Triage Vitals  Enc Vitals Group     BP 06/14/15 1847 142/86 mmHg     Pulse Rate 06/14/15 1847 80     Resp 06/14/15 1847 20     Temp 06/14/15 1847 98.6 F (37 C)     Temp Source 06/14/15 1847 Oral     SpO2 06/14/15 1847 98 %     Weight 06/14/15 1847 180 lb (81.647 kg)     Height 06/14/15 1847  (1.753 m)     Head Cir --      Peak Flow --      Pain Score 06/14/15 1849 10     Pain Loc --      Pain Edu? --      Excl. in GC? --     Constitutional: Alert and oriented. Well appearing and in no acute distress. Eyes: Conjunctivae are normal. PERRL. EOMI. Head: Atraumatic. Nose: No congestion/rhinnorhea. Mouth/Throat: Mucous membranes are moist.  Oropharynx non-erythematous. Neck: No stridor.  No cervical spine tenderness to palpation. Cardiovascular: Normal rate, regular rhythm. Grossly normal heart sounds.  Good peripheral circulation. Respiratory: Normal  respiratory effort.  No retractions. Lungs CTAB. Good air entry into the bases. No Breath Sounds. Gastrointestinal: Soft and nontender. No distention. No abdominal bruits. No CVA tenderness. Musculoskeletal: No lower extremity tenderness nor edema.  No joint effusions. Patient is tender to palpation diffusely midline thoracic region. Patient is tender to palpation over left lateral ribs. No deformity is visualized. No deformities palpated. No paradoxical chest wall. Neurologic:  Normal speech and language. No gross focal neurologic deficits are appreciated. No gait instability. Skin:  Skin is warm,  dry and intact. No rash noted. Psychiatric: Mood and affect are normal. Speech and behavior are normal.  ____________________________________________   LABS (all labs ordered are listed, but only abnormal results are displayed)  Labs Reviewed - No data to display ____________________________________________  EKG   ____________________________________________  RADIOLOGY  Thoracic spine x-ray Impression: No evidence of fracture or subluxation along thoracic spine.   Left rib x-ray Impression:Nondisplaced rib fracture 10th rib. No pneumothorax.  ____________________________________________   PROCEDURES  Procedure(s) performed: None  Critical Care performed: No  ____________________________________________   INITIAL IMPRESSION / ASSESSMENT AND PLAN / ED COURSE  Pertinent labs & imaging results that were available during my care of the patient were reviewed by me and considered in my medical decision making (see chart for details).  Patient's diagnosis is based off of patient's history, symptoms, physical exam as well as her radiological findings. Patient has a nondisplaced rib fracture 10th rib left side. I advised patient of findings and diagnosis and he verbalized understanding. Advised patient and place him on pain medications. Gave patient strict ED precautions to return for any increased pain or shortness of breath. Patient verbalizes understanding of this. Patient will follow-up with his primary care provider. Patient verbalizes understanding of treatment plan and verbalizes compliance with same. ____________________________________________   FINAL CLINICAL IMPRESSION(S) / ED DIAGNOSES  Final diagnoses:  Left rib fracture, closed, initial encounter      Racheal PatchesJonathan D Torrence Branagan, PA-C 06/14/15 2032  Phineas SemenGraydon Goodman, MD 06/14/15 2108

## 2015-06-14 NOTE — Discharge Instructions (Signed)
Blunt Chest Trauma °Blunt chest trauma is an injury caused by a blow to the chest. These chest injuries can be very painful. Blunt chest trauma often results in bruised or broken (fractured) ribs. Most cases of bruised and fractured ribs from blunt chest traumas get better after 1 to 3 weeks of rest and pain medicine. Often, the soft tissue in the chest wall is also injured, causing pain and bruising. Internal organs, such as the heart and lungs, may also be injured. Blunt chest trauma can lead to serious medical problems. This injury requires immediate medical care. °CAUSES  °· Motor vehicle collisions. °· Falls. °· Physical violence. °· Sports injuries. °SYMPTOMS  °· Chest pain. The pain may be worse when you move or breathe deeply. °· Shortness of breath. °· Lightheadedness. °· Bruising. °· Tenderness. °· Swelling. °DIAGNOSIS  °Your caregiver will do a physical exam. X-rays may be taken to look for fractures. However, minor rib fractures may not show up on X-rays until a few days after the injury. If a more serious injury is suspected, further imaging tests may be done. This may include ultrasounds, computed tomography (CT) scans, or magnetic resonance imaging (MRI). °TREATMENT  °Treatment depends on the severity of your injury. Your caregiver may prescribe pain medicines and deep breathing exercises. °HOME CARE INSTRUCTIONS °· Limit your activities until you can move around without much pain. °· Do not do any strenuous work until your injury is healed. °· Put ice on the injured area. °¨ Put ice in a plastic bag. °¨ Place a towel between your skin and the bag. °¨ Leave the ice on for 15-20 minutes, 03-04 times a day. °· You may wear a rib belt as directed by your caregiver to reduce pain. °· Practice deep breathing as directed by your caregiver to keep your lungs clear. °· Only take over-the-counter or prescription medicines for pain, fever, or discomfort as directed by your caregiver. °SEEK IMMEDIATE MEDICAL  CARE IF:  °· You have increasing pain or shortness of breath. °· You cough up blood. °· You have nausea, vomiting, or abdominal pain. °· You have a fever. °· You feel dizzy, weak, or you faint. °MAKE SURE YOU: °· Understand these instructions. °· Will watch your condition. °· Will get help right away if you are not doing well or get worse. °  °This information is not intended to replace advice given to you by your health care provider. Make sure you discuss any questions you have with your health care provider. °  °Document Released: 08/23/2004 Document Revised: 08/06/2014 Document Reviewed: 01/12/2015 °Elsevier Interactive Patient Education ©2016 [employer] ° °Rib Fracture °A rib fracture is a break or crack in one of the bones of the ribs. The ribs are a group of long, curved bones that wrap around your chest and attach to your spine. They protect your lungs and other organs in the chest cavity. A broken or cracked rib is often painful, but most do not cause other problems. Most rib fractures heal on their own over time. However, rib fractures can be more serious if multiple ribs are broken or if broken ribs move out of place and push against other structures. °CAUSES  °· A direct blow to the chest. For example, this could happen during contact sports, a car accident, or a fall against a hard object. °· Repetitive movements with high force, such as pitching a baseball or having severe coughing spells. °SYMPTOMS  °· Pain when you breathe in or cough. °·   Pain when someone presses on the injured area. °DIAGNOSIS  °Your caregiver will perform a physical exam. Various imaging tests may be ordered to confirm the diagnosis and to look for related injuries. These tests may include a chest X-ray, computed tomography (CT), magnetic resonance imaging (MRI), or a bone scan. °TREATMENT  °Rib fractures usually heal on their own in 1-3 months. The longer healing period is often associated with a continued cough or other  aggravating activities. During the healing period, pain control is very important. Medication is usually given to control pain. Hospitalization or surgery may be needed for more severe injuries, such as those in which multiple ribs are broken or the ribs have moved out of place.  °HOME CARE INSTRUCTIONS  °· Avoid strenuous activity and any activities or movements that cause pain. Be careful during activities and avoid bumping the injured rib. °· Gradually increase activity as directed by your caregiver. °· Only take over-the-counter or prescription medications as directed by your caregiver. Do not take other medications without asking your caregiver first. °· Apply ice to the injured area for the first 1-2 days after you have been treated or as directed by your caregiver. Applying ice helps to reduce inflammation and pain. °¨ Put ice in a plastic bag. °¨ Place a towel between your skin and the bag.   °¨ Leave the ice on for 15-20 minutes at a time, every 2 hours while you are awake. °· Perform deep breathing as directed by your caregiver. This will help prevent pneumonia, which is a common complication of a broken rib. Your caregiver may instruct you to: °¨ Take deep breaths several times a day. °¨ Try to cough several times a day, holding a pillow against the injured area. °¨ Use a device called an incentive spirometer to practice deep breathing several times a day. °· Drink enough fluids to keep your urine clear or pale yellow. This will help you avoid constipation.   °· Do not wear a rib belt or binder. These restrict breathing, which can lead to pneumonia.   °SEEK IMMEDIATE MEDICAL CARE IF:  °· You have a fever.   °· You have difficulty breathing or shortness of breath.   °· You develop a continual cough, or you cough up thick or bloody sputum. °· You feel sick to your stomach (nausea), throw up (vomit), or have abdominal pain.   °· You have worsening pain not controlled with medications.   °MAKE SURE  YOU: °· Understand these instructions. °· Will watch your condition. °· Will get help right away if you are not doing well or get worse. °  °This information is not intended to replace advice given to you by your health care provider. Make sure you discuss any questions you have with your health care provider. °  °Document Released: 07/16/2005 Document Revised: 03/18/2013 Document Reviewed: 09/17/2012 °Elsevier Interactive Patient Education ©2016 [employer] ° °

## 2015-06-14 NOTE — ED Notes (Signed)
Pt was playing basketball today and fell backwards onto concrete.  No loc. Pt has back pain.  Pt has abrasion to left elbow.  No neck pain  No headache.

## 2015-06-20 ENCOUNTER — Other Ambulatory Visit: Payer: Self-pay

## 2015-06-20 MED ORDER — OXYCODONE-ACETAMINOPHEN 5-325 MG PO TABS
1.0000 | ORAL_TABLET | Freq: Four times a day (QID) | ORAL | Status: DC | PRN
Start: 2015-06-20 — End: 2021-04-14

## 2015-06-20 NOTE — Telephone Encounter (Signed)
Pt left v/m requesting rx oxycodone apap for fx rib pain. Call when ready for pick up. Pt last seen 04/21/15. Pt [facility]14[facility]. Last filled # 20 on 06/14/15. Pt said the rib pain is slightly better but still has pain upon movement; difficult to get in and out of car.

## 2015-06-20 NOTE — Telephone Encounter (Signed)
Called and notified patient of Kate's comments. Patient verbalized understanding. Left Rx in front office to pick up.

## 2015-07-14 ENCOUNTER — Other Ambulatory Visit: Payer: Self-pay | Admitting: Primary Care

## 2015-07-14 DIAGNOSIS — E785 Hyperlipidemia, unspecified: Secondary | ICD-10-CM

## 2015-07-19 ENCOUNTER — Ambulatory Visit (INDEPENDENT_AMBULATORY_CARE_PROVIDER_SITE_OTHER): Payer: Federal, State, Local not specified - PPO | Admitting: Primary Care

## 2015-07-19 ENCOUNTER — Ambulatory Visit (INDEPENDENT_AMBULATORY_CARE_PROVIDER_SITE_OTHER)
Admission: RE | Admit: 2015-07-19 | Discharge: 2015-07-19 | Disposition: A | Discharge status: Home / Self Care | Payer: Federal, State, Local not specified - PPO | Source: Ambulatory Visit | Attending: Primary Care | Admitting: Primary Care

## 2015-07-19 ENCOUNTER — Encounter: Payer: Self-pay | Admitting: Primary Care

## 2015-07-19 VITALS — BP 106/70 | HR 66 | Temp 98.0°F | Ht 67.5 in | Wt 185.4 lb

## 2015-07-19 DIAGNOSIS — R0781 Pleurodynia: Secondary | ICD-10-CM | POA: Diagnosis not present

## 2015-07-19 IMAGING — CR DG RIBS 2V*L*
5 series · 5 of 5 positions shown · non-contrast
Comparison: [DATE].

CLINICAL DATA: Increased pain.  Known rib fracture.

EXAM:
LEFT RIBS - 2 VIEW

[view not recorded (1 of 5)]
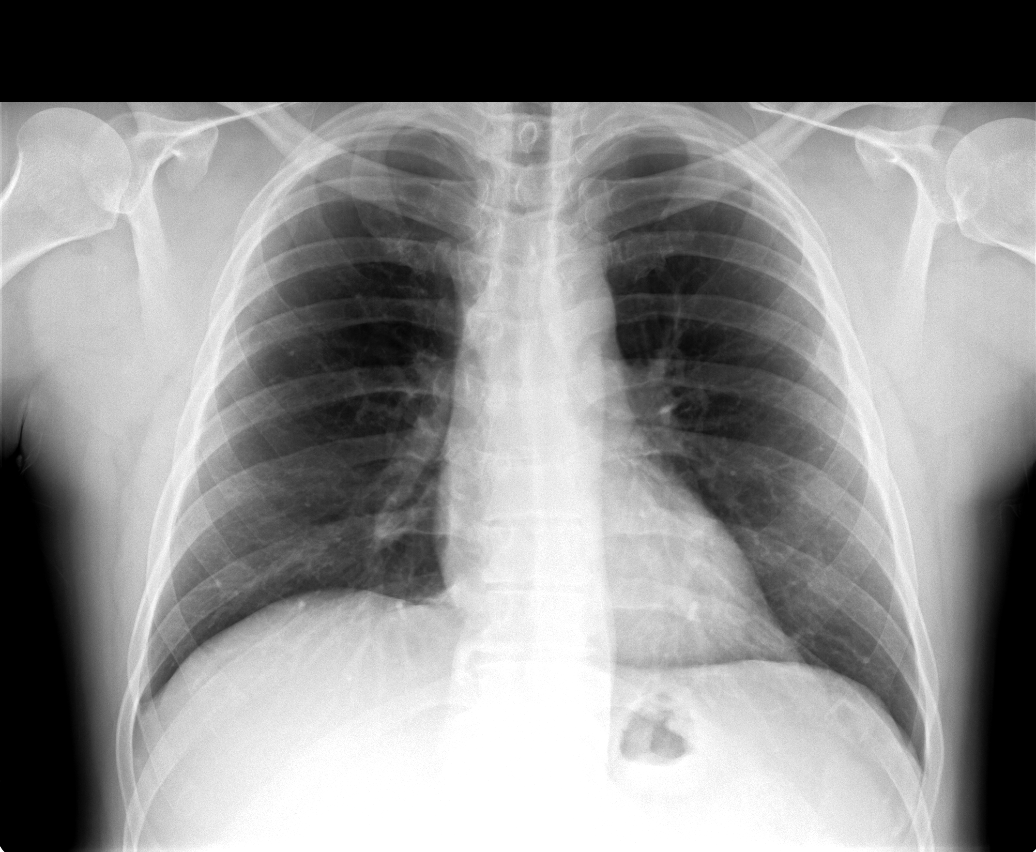

[view not recorded (2 of 5)]
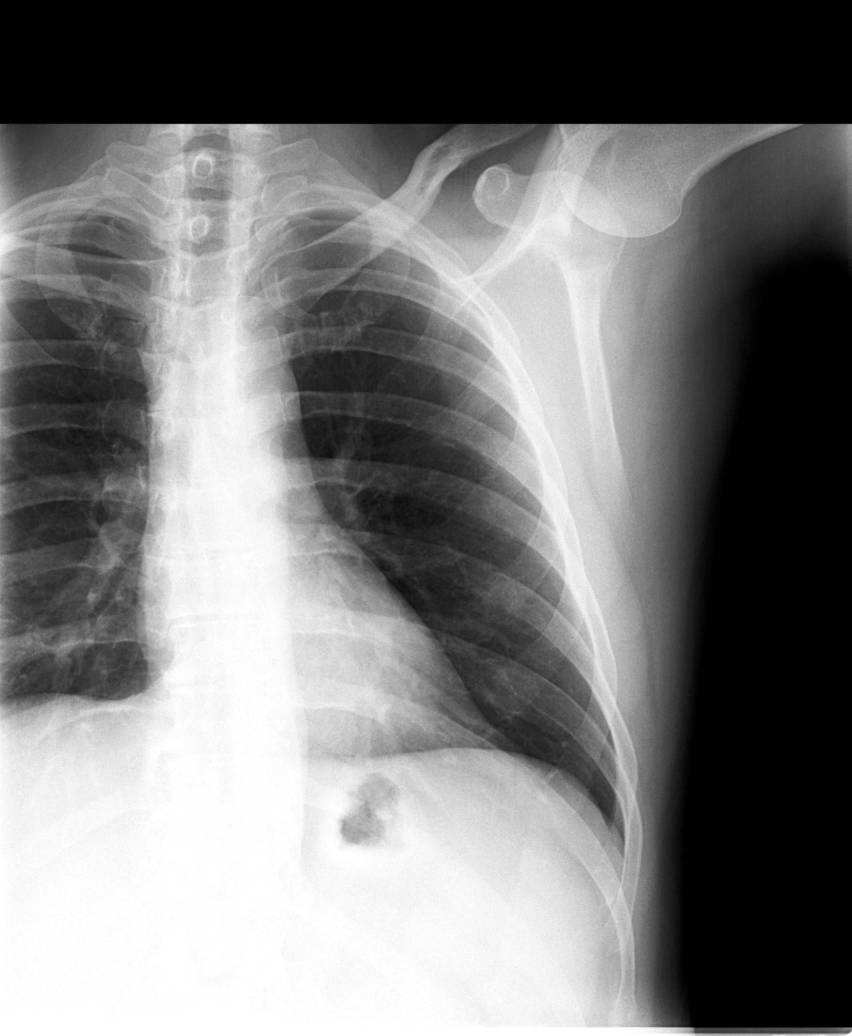

[view not recorded (3 of 5)]
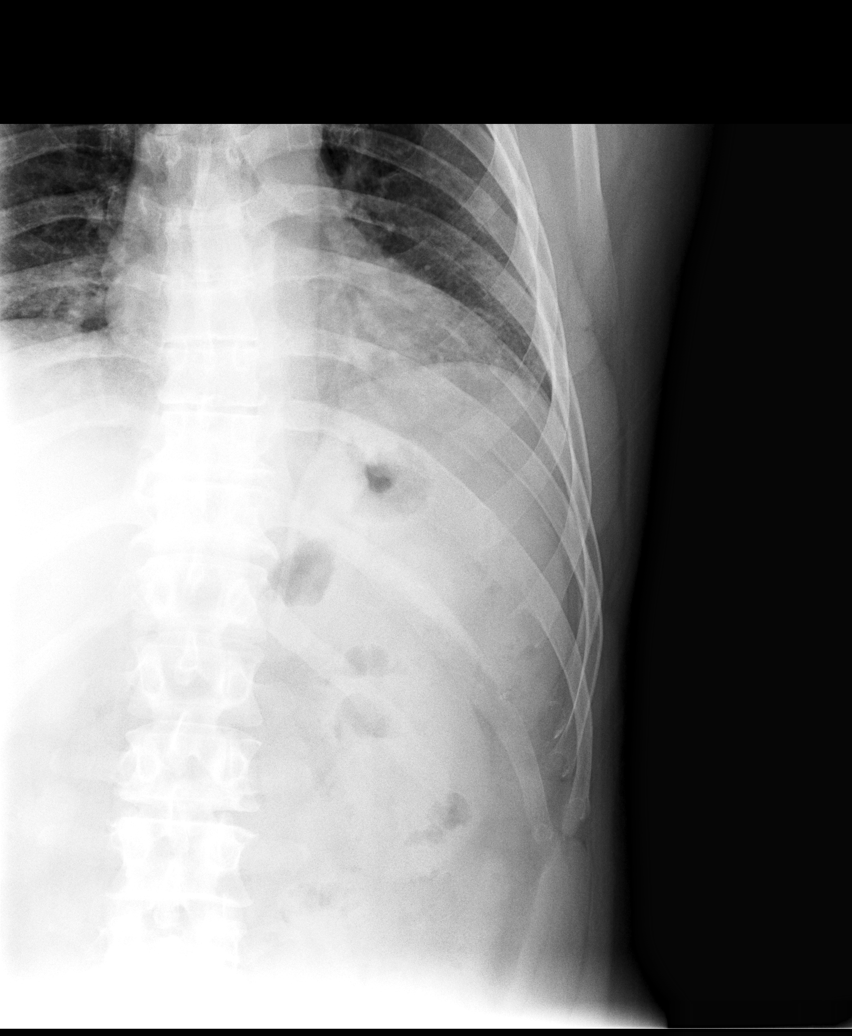

[view not recorded (4 of 5)]
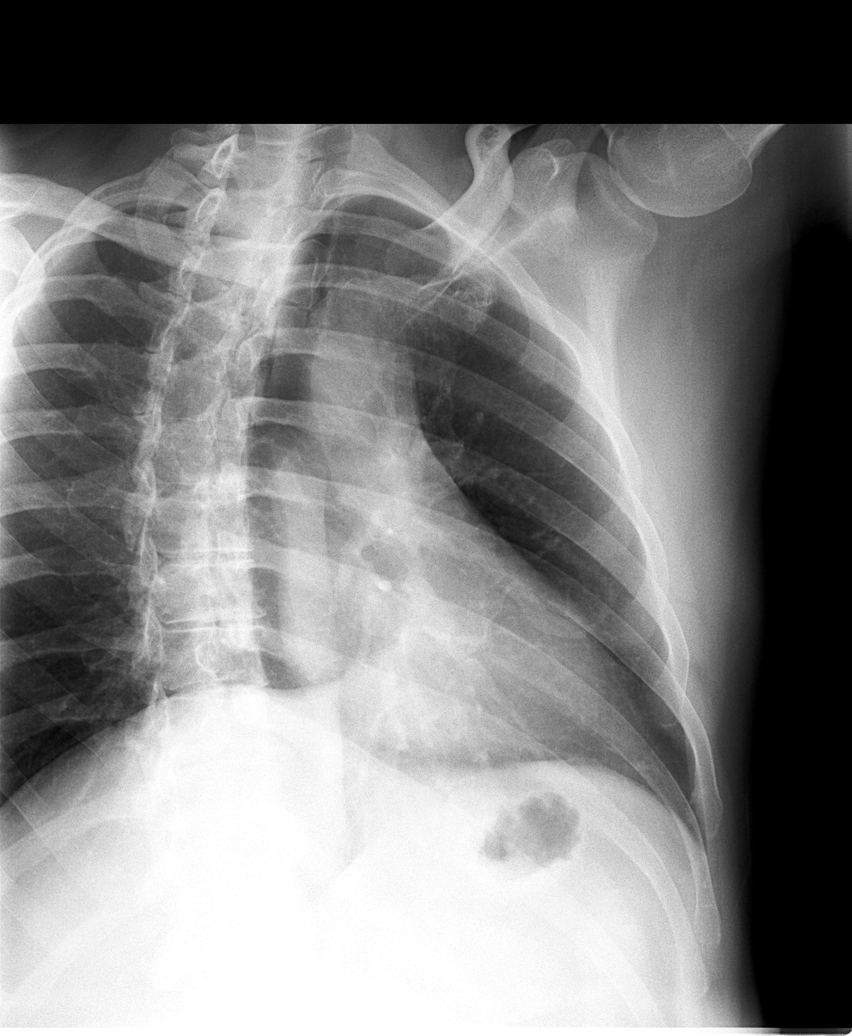

[view not recorded (5 of 5)]
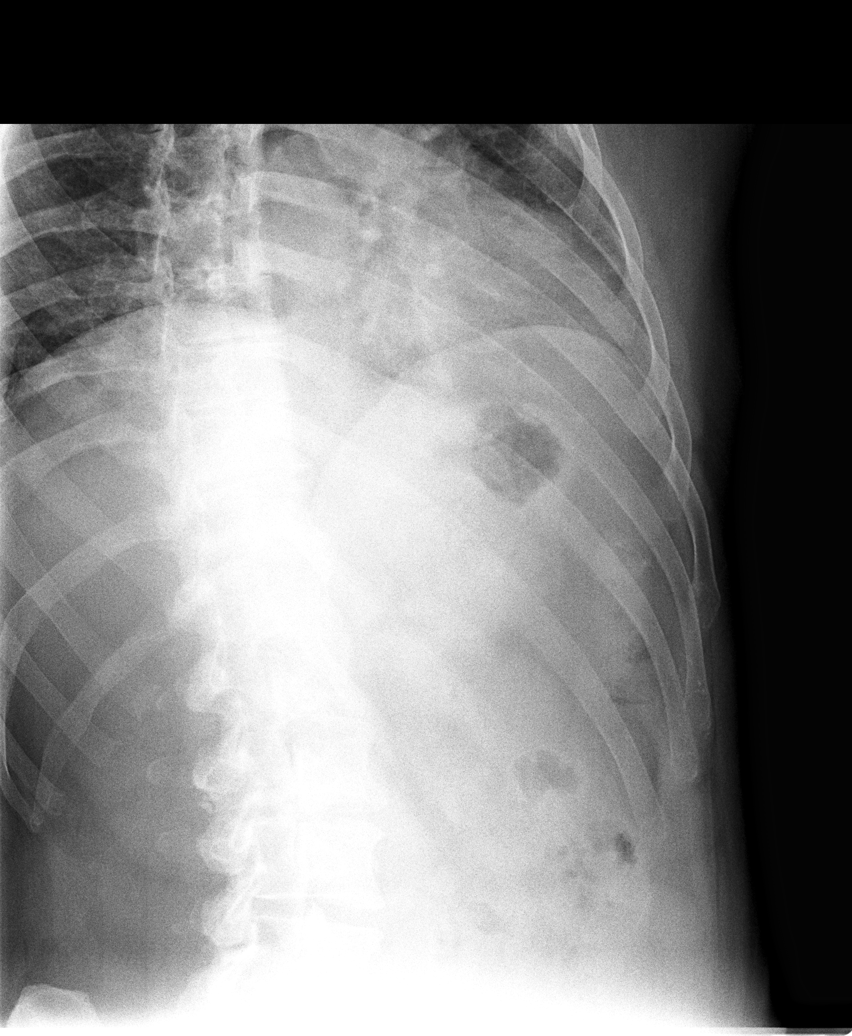

[5 of 5 positions shown; findings below may reference images not displayed]

FINDINGS: No new rib fracture noted. Previously identified left tenth rib
fracture is not definitely identified on today's exam. No
pneumothorax.
IMPRESSION: No acute abnormality identified .

## 2015-07-19 NOTE — Progress Notes (Signed)
Pre visit review using our clinic review tool, if applicable. No additional management support is needed unless otherwise documented below in the visit note. 

## 2015-07-19 NOTE — Progress Notes (Signed)
Subjective:    Patient ID: Dustin Todd, male    DOB: August 07, 1965, 49 y.o.   MRN: [id number]  HPI  Dustin Todd is a 49 year old male who presents today for   He was evaluated on 06/14/15 at [facility] ED for complaints of back and left sided rib pain. He was playing basketball, was going up for a dunk, when someone undercut him. He fell and landed on the left side. He was found to have a non displaced rib fracture to the 10th rib without evidence of pneumothorax. He was provided with a prescription for percocet to take PRN pain.  Since his discharge from the ED he's continuing to experience pain, moreso to the anterior chest wall. He was told that his fracture was located to the left posterior chest wall. He's noticed increased in shortness of breath when exerting himself. Denies shortness of breath at rest. The percocet never helped to reduce his pain, except for at night to help him sleep.  He's having to prop himself up at night in order to sleep.   Review of Systems  Constitutional: Negative for fever and chills.  Respiratory: Positive for shortness of breath. Negative for wheezing.   Cardiovascular:       Left anterior and posterior left chest wall/rib pain       No past medical history on file.  Social History   Social History  . Marital Status: Married    Spouse Name: N/A  . Number of Children: N/A  . Years of Education: N/A   Occupational History  . Not on file.   Social History Main Topics  . Smoking status: Never Smoker   . Smokeless tobacco: Never Used  . Alcohol Use: 0.0 oz/week    0 Standard drinks or equivalent per week  . Drug Use: No  . Sexual Activity: Not on file   Other Topics Concern  . Not on file   Social History Narrative   Married.   2 children (boy and girl).   Works in [employer].   Enjoys going to [employer], golfing, basketball.    No past surgical history on file.  Family History  Problem Relation Age of Onset  . Cancer Paternal  Grandfather     Bone  . Cancer Maternal Grandmother     Breast    Allergies  Allergen Reactions  . Penicillins Rash    Current Outpatient Prescriptions on File Prior to Visit  Medication Sig Dispense Refill  . oxyCODONE-acetaminophen (ROXICET) 5-325 MG tablet Take 1 tablet by mouth every 6 (six) hours as needed for severe pain. (Patient not taking: Reported on 07/19/2015) 20 tablet 0   No current facility-administered medications on file prior to visit.    BP 106/70 mmHg  Pulse 66  Temp(Src) 98 F (36.7 C) (Oral)  Ht 5' 7.5" (1.715 m)  Wt 185 lb 6.4 oz (84.097 kg)  BMI 28.59 kg/m2  SpO2 98%    Objective:   Physical Exam  Constitutional: He appears well-nourished.  Cardiovascular: Normal rate and regular rhythm.   Pulmonary/Chest: Effort normal. No accessory muscle usage. He has no decreased breath sounds. He has no wheezes. He exhibits bony tenderness. He exhibits no crepitus, no deformity and no swelling.  No obvious evidence of pneumothorax.  Musculoskeletal:  Tender to anterior left chest wall at approximately the 10th rib. Non tender to posterior chest wall.  Skin: Skin is warm and dry.          Assessment &  Plan:  Rib Pain:  Known rib fracture to left 10 th rib since 06/14/15 as evidenced by xray. Evaluated in ED and provided with percocet.  Increased pain to anterior chest wall and shortness of breath over last week. No obvious evidence of pneumothorax. Lungs clear. Anterior chest wall tender on left side, about 10th rib. Due to increased pain and SOB, will obtain follow up xray to evaluate for any additional fracture or pneumothorax. Xray pending. Discussed pain management at home. Return precautions provided.  Reviewed imaging and notes from ED visit on 06/14/15. Will repeat xray today.

## 2015-07-19 NOTE — Patient Instructions (Signed)
Complete xray(s) prior to leaving today. I will notify you of your results once received.  Try taking Ibuprofen 600 mg three times daily as needed for pain/inflammation to chest wall.  Continue percocet at bedtime. Do not take any tylenol.   These fractures can take time for complete healing.  It was a pleasure to see you today!  Rib Fracture A rib fracture is a break or crack in one of the bones of the ribs. The ribs are a group of long, curved bones that wrap around your chest and attach to your spine. They protect your lungs and other organs in the chest cavity. A broken or cracked rib is often painful, but most do not cause other problems. Most rib fractures heal on their own over time. However, rib fractures can be more serious if multiple ribs are broken or if broken ribs move out of place and push against other structures. CAUSES   A direct blow to the chest. For example, this could happen during contact sports, a car accident, or a fall against a hard object.  Repetitive movements with high force, such as pitching a baseball or having severe coughing spells. SYMPTOMS   Pain when you breathe in or cough.  Pain when someone presses on the injured area. DIAGNOSIS  Your caregiver will perform a physical exam. Various imaging tests may be ordered to confirm the diagnosis and to look for related injuries. These tests may include a chest X-ray, computed tomography (CT), magnetic resonance imaging (MRI), or a bone scan. TREATMENT  Rib fractures usually heal on their own in 1-3 months. The longer healing period is often associated with a continued cough or other aggravating activities. During the healing period, pain control is very important. Medication is usually given to control pain. Hospitalization or surgery may be needed for more severe injuries, such as those in which multiple ribs are broken or the ribs have moved out of place.  HOME CARE INSTRUCTIONS   Avoid strenuous activity  and any activities or movements that cause pain. Be careful during activities and avoid bumping the injured rib.  Gradually increase activity as directed by your caregiver.  Only take over-the-counter or prescription medications as directed by your caregiver. Do not take other medications without asking your caregiver first.  Apply ice to the injured area for the first 1-2 days after you have been treated or as directed by your caregiver. Applying ice helps to reduce inflammation and pain.  Put ice in a plastic bag.  Place a towel between your skin and the bag.   Leave the ice on for 15-20 minutes at a time, every 2 hours while you are awake.  Perform deep breathing as directed by your caregiver. This will help prevent pneumonia, which is a common complication of a broken rib. Your caregiver may instruct you to:  Take deep breaths several times a day.  Try to cough several times a day, holding a pillow against the injured area.  Use a device called an incentive spirometer to practice deep breathing several times a day.  Drink enough fluids to keep your urine clear or pale yellow. This will help you avoid constipation.   Do not wear a rib belt or binder. These restrict breathing, which can lead to pneumonia.  SEEK IMMEDIATE MEDICAL CARE IF:   You have a fever.   You have difficulty breathing or shortness of breath.   You develop a continual cough, or you cough up thick or bloody sputum.  You feel sick to your stomach (nausea), throw up (vomit), or have abdominal pain.   You have worsening pain not controlled with medications.  MAKE SURE YOU:  Understand these instructions.  Will watch your condition.  Will get help right away if you are not doing well or get worse.   This information is not intended to replace advice given to you by your health care provider. Make sure you discuss any questions you have with your health care provider.   Document Released:  07/16/2005 Document Revised: 03/18/2013 Document Reviewed: 09/17/2012 Elsevier Interactive Patient Education [employer]

## 2015-07-21 ENCOUNTER — Other Ambulatory Visit: Payer: Federal, State, Local not specified - PPO

## 2015-08-08 ENCOUNTER — Other Ambulatory Visit: Payer: Federal, State, Local not specified - PPO

## 2021-03-31 ENCOUNTER — Emergency Department (HOSPITAL_COMMUNITY): Payer: Federal, State, Local not specified - PPO

## 2021-03-31 ENCOUNTER — Emergency Department (HOSPITAL_COMMUNITY)
Admission: EM | Admit: 2021-03-31 | Discharge: 2021-04-01 | Disposition: A | Discharge status: Home / Self Care | Payer: Federal, State, Local not specified - PPO | Attending: Emergency Medicine | Admitting: Emergency Medicine

## 2021-03-31 ENCOUNTER — Encounter (HOSPITAL_COMMUNITY): Payer: Self-pay

## 2021-03-31 ENCOUNTER — Other Ambulatory Visit: Payer: Self-pay

## 2021-03-31 DIAGNOSIS — R200 Anesthesia of skin: Secondary | ICD-10-CM | POA: Diagnosis present

## 2021-03-31 DIAGNOSIS — R202 Paresthesia of skin: Secondary | ICD-10-CM

## 2021-03-31 DIAGNOSIS — R791 Abnormal coagulation profile: Secondary | ICD-10-CM | POA: Insufficient documentation

## 2021-03-31 DIAGNOSIS — R42 Dizziness and giddiness: Secondary | ICD-10-CM

## 2021-03-31 DIAGNOSIS — R519 Headache, unspecified: Secondary | ICD-10-CM | POA: Insufficient documentation

## 2021-03-31 HISTORY — DX: Radiculopathy, site unspecified: M54.10

## 2021-03-31 HISTORY — DX: Headache, unspecified: R51.9

## 2021-03-31 LAB — DIFFERENTIAL
Abs Immature Granulocytes: 0.02 10*3/uL (ref 0.00–0.07)
Basophils Absolute: 0 10*3/uL (ref 0.0–0.1)
Basophils Relative: 1 %
Eosinophils Absolute: 0.1 10*3/uL (ref 0.0–0.5)
Eosinophils Relative: 1 %
Immature Granulocytes: 0 %
Lymphocytes Relative: 28 %
Lymphs Abs: 1.5 10*3/uL (ref 0.7–4.0)
Monocytes Absolute: 0.4 10*3/uL (ref 0.1–1.0)
Monocytes Relative: 7 %
Neutro Abs: 3.3 10*3/uL (ref 1.7–7.7)
Neutrophils Relative %: 63 %

## 2021-03-31 LAB — PROTIME-INR
INR: 1 (ref 0.8–1.2)
Prothrombin Time: 12.9 seconds (ref 11.4–15.2)

## 2021-03-31 LAB — CBC
HCT: 47.1 % (ref 39.0–52.0)
Hemoglobin: 16.2 g/dL (ref 13.0–17.0)
MCH: 31.6 pg (ref 26.0–34.0)
MCHC: 34.4 g/dL (ref 30.0–36.0)
MCV: 91.8 fL (ref 80.0–100.0)
Platelets: 168 10*3/uL (ref 150–400)
RBC: 5.13 MIL/uL (ref 4.22–5.81)
RDW: 11.9 % (ref 11.5–15.5)
WBC: 5.3 10*3/uL (ref 4.0–10.5)
nRBC: 0 % (ref 0.0–0.2)

## 2021-03-31 LAB — I-STAT CHEM 8, ED
BUN: 20 mg/dL (ref 6–20)
Calcium, Ion: 1.23 mmol/L (ref 1.15–1.40)
Chloride: 102 mmol/L (ref 98–111)
Creatinine, Ser: 1 mg/dL (ref 0.61–1.24)
Glucose, Bld: 95 mg/dL (ref 70–99)
HCT: 47 % (ref 39.0–52.0)
Hemoglobin: 16 g/dL (ref 13.0–17.0)
Potassium: 4.1 mmol/L (ref 3.5–5.1)
Sodium: 139 mmol/L (ref 135–145)
TCO2: 27 mmol/L (ref 22–32)

## 2021-03-31 LAB — COMPREHENSIVE METABOLIC PANEL
ALT: 26 U/L (ref 0–44)
AST: 25 U/L (ref 15–41)
Albumin: 4.5 g/dL (ref 3.5–5.0)
Alkaline Phosphatase: 32 U/L — ABNORMAL LOW (ref 38–126)
Anion gap: 9 (ref 5–15)
BUN: 16 mg/dL (ref 6–20)
CO2: 26 mmol/L (ref 22–32)
Calcium: 9.4 mg/dL (ref 8.9–10.3)
Chloride: 103 mmol/L (ref 98–111)
Creatinine, Ser: 1.08 mg/dL (ref 0.61–1.24)
GFR, Estimated: >60 mL/min (ref >60)
Glucose, Bld: 97 mg/dL (ref 70–99)
Potassium: 3.9 mmol/L (ref 3.5–5.1)
Sodium: 138 mmol/L (ref 135–145)
Total Bilirubin: 0.9 mg/dL (ref 0.3–1.2)
Total Protein: 7.1 g/dL (ref 6.5–8.1)

## 2021-03-31 LAB — APTT: aPTT: 29 seconds (ref 24–36)

## 2021-03-31 LAB — CBG MONITORING, ED: Glucose-Capillary: 91 mg/dL (ref 70–99)

## 2021-03-31 IMAGING — MR MR HEAD W/O CM
6 of 10 series · 29 of 48 positions shown · non-contrast
Comparison: None.

CLINICAL DATA: Stroke follow-up

EXAM:
MRI HEAD WITHOUT CONTRAST
TECHNIQUE: Multiplanar, multiecho pulse sequences of the brain and surrounding
structures were obtained without intravenous contrast.

[Series 2: FLAIR · sagittal · 5.0mm · 0.23mm/px · 2 of 23 slices shown (1 of 2)]
[im 1/23]
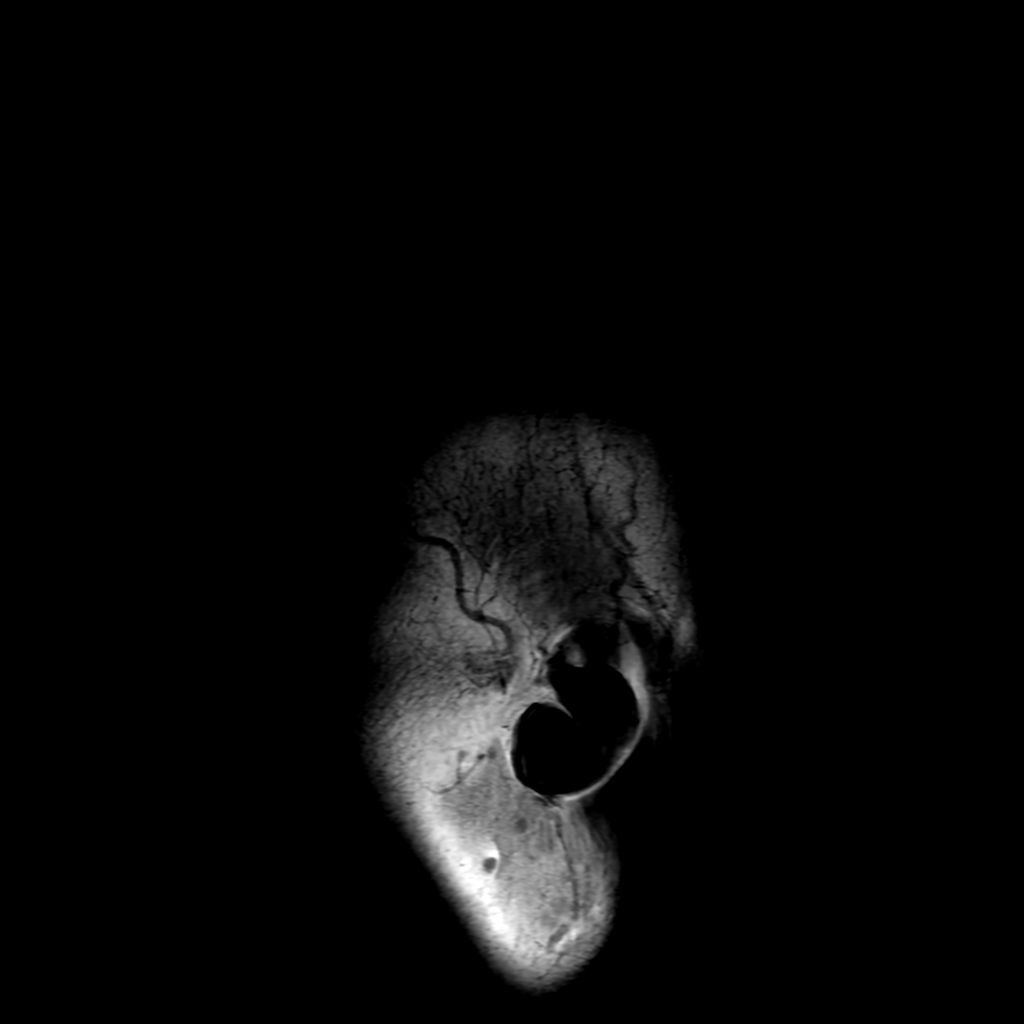
[im 23/23]
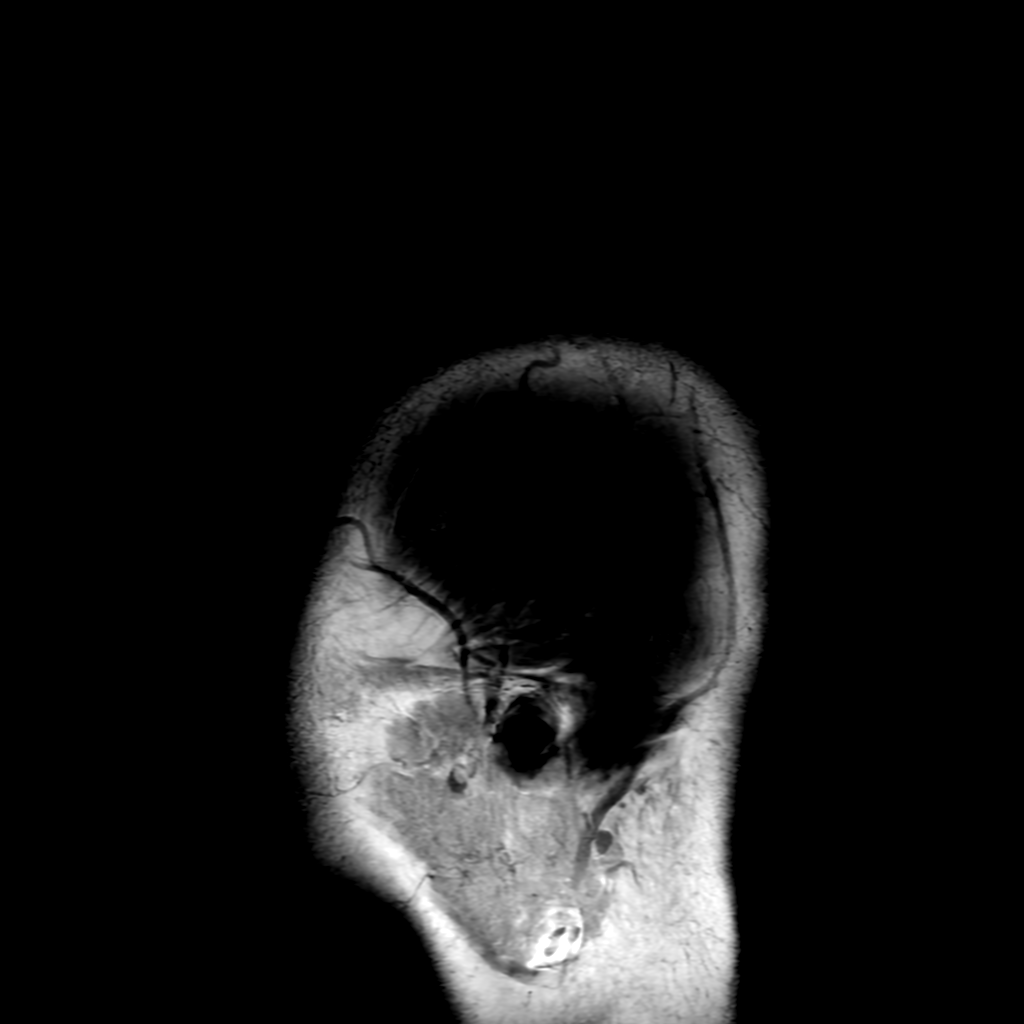

[Series 3: DWI · axial · 3.0mm · 0.94mm/px · z∈[-97,+55]mm · 9 of 104 slices shown (1 of 2)]
[im 1/104]
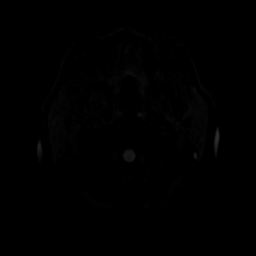
[im 13/104]
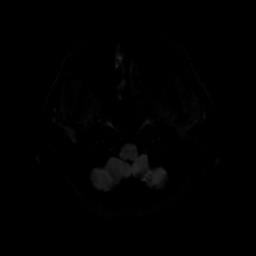
[im 26/104]
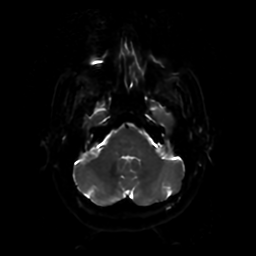
[im 39/104]
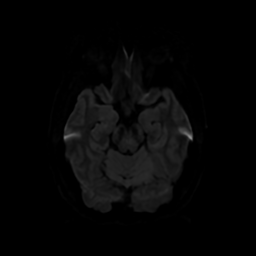
[im 52/104]
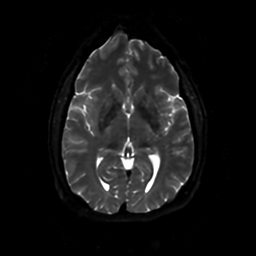
[im 65/104]
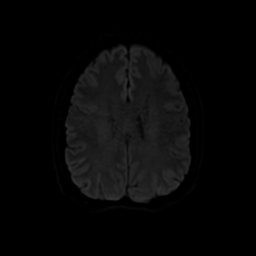
[im 78/104]
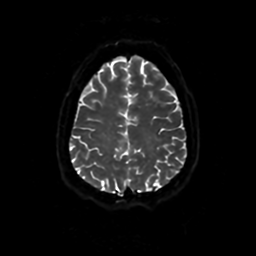
[im 91/104]
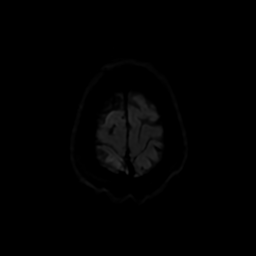
[im 104/104]
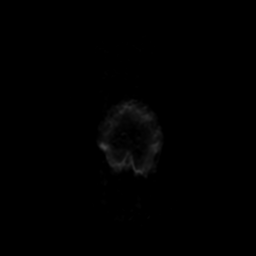

[Series 4: DWI · coronal · 4.0mm · 0.94mm/px · 7 of 74 slices shown (2 of 2)]
[im 1/74]
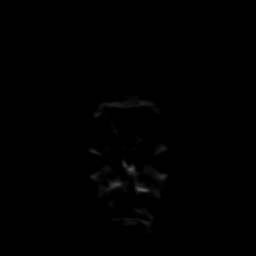
[im 13/74]
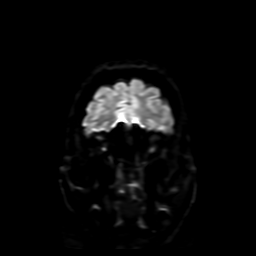
[im 25/74]
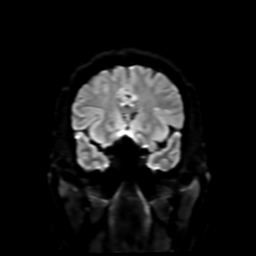
[im 37/74]
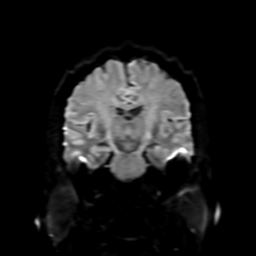
[im 49/74]
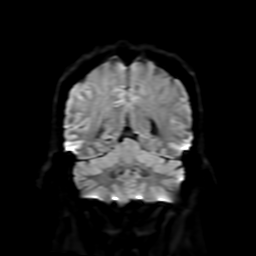
[im 61/74]
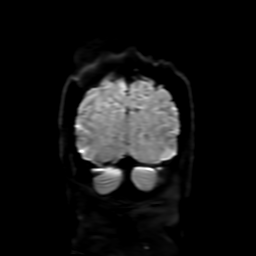
[im 74/74]
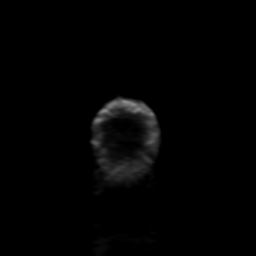

[Series 7: FLAIR · axial · 4.0mm · 0.45mm/px · z∈[-91,+58]mm · 3 of 35 slices shown (2 of 2)]
[im 1/35]
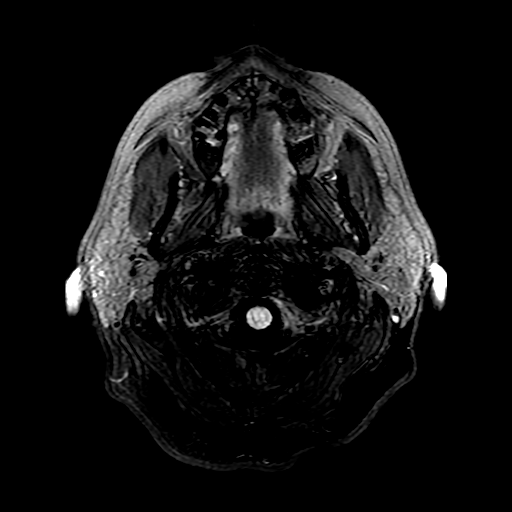
[im 18/35]
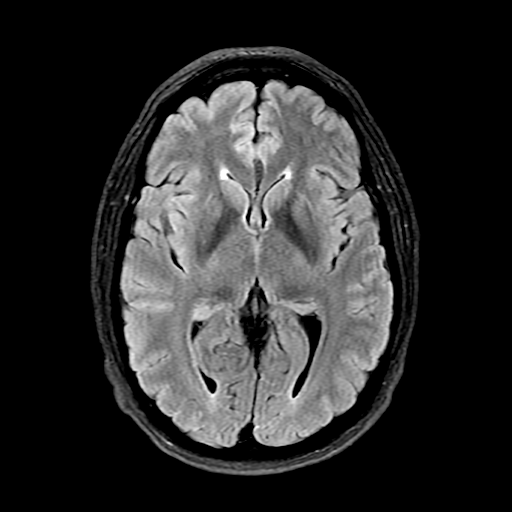
[im 35/35]
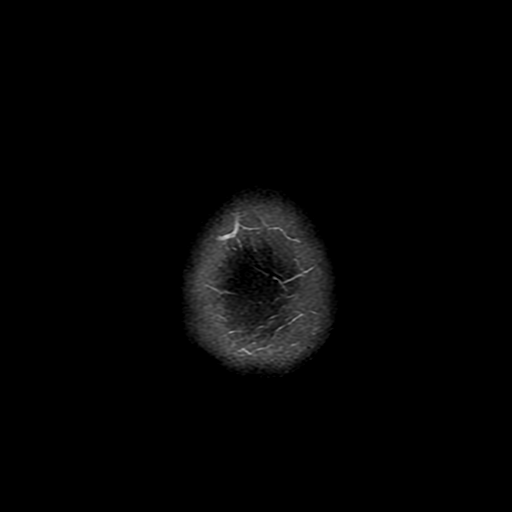

[Series 350: ADC · axial · 3.0mm · 0.94mm/px · z∈[-97,+55]mm · 5 of 52 slices shown (1 of 2)]
[im 1/52]
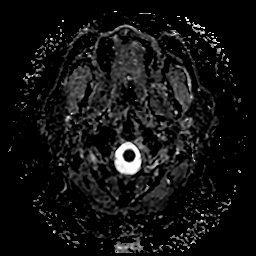
[im 13/52]
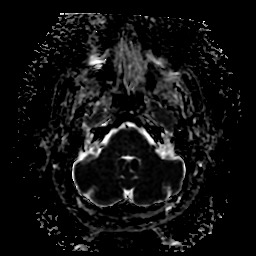
[im 26/52]
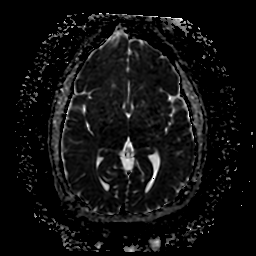
[im 39/52]
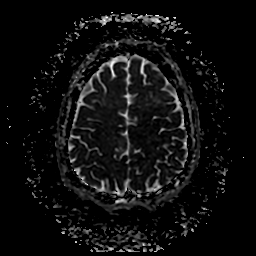
[im 52/52]
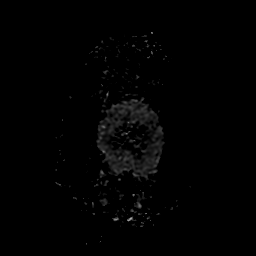

[Series 450: ADC · coronal · 4.0mm · 0.94mm/px · 3 of 37 slices shown (2 of 2)]
[im 1/37]
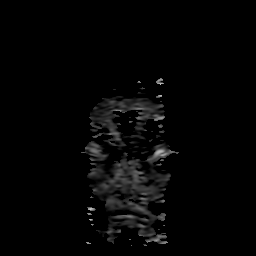
[im 19/37]
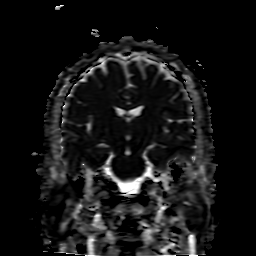
[im 37/37]
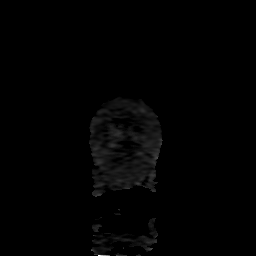

[29 of 48 positions shown; findings below may reference images not displayed]

FINDINGS: Brain: No acute infarct, mass effect or extra-axial collection. No
acute or chronic hemorrhage. Normal white matter signal, parenchymal
volume and CSF spaces. The midline structures are normal.

Vascular: Major flow voids are preserved.

Skull and upper cervical spine: Normal calvarium and skull base.
Visualized upper cervical spine and soft tissues are normal.

Sinuses/Orbits:No paranasal sinus fluid levels or advanced mucosal
thickening. No mastoid or middle ear effusion. Normal orbits.
IMPRESSION: Normal brain MRI.

## 2021-03-31 IMAGING — CT CT HEAD CODE STROKE
3 series · 14 of 47 positions shown, 16 images · non-contrast
Comparison: None.

CLINICAL DATA: Code stroke.  Left arm numbness

EXAM:
CT HEAD WITHOUT CONTRAST
TECHNIQUE: Contiguous axial images were obtained from the base of the skull
through the vertex without intravenous contrast.

[Series 3: head 3.0 cor st · coronal · 0.35mm/px · 3 of 61 slices shown]
[im 21/61  brain]
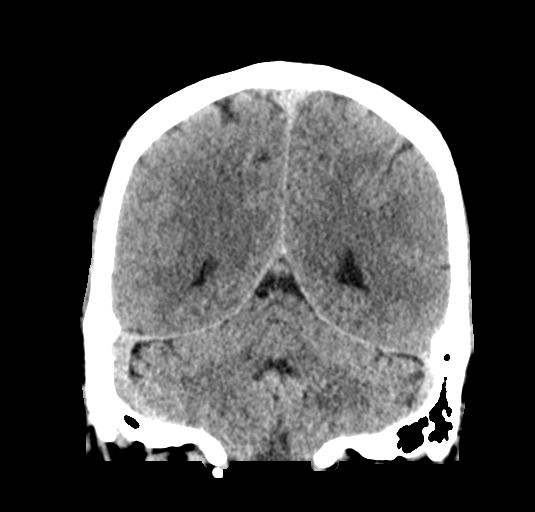
[im 27/61  brain]
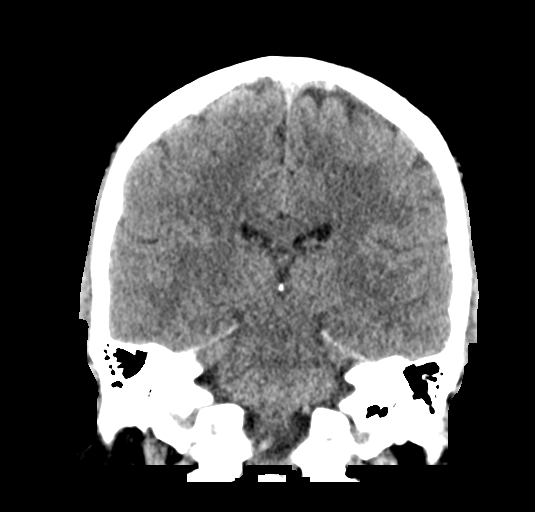
[im 34/61  brain]
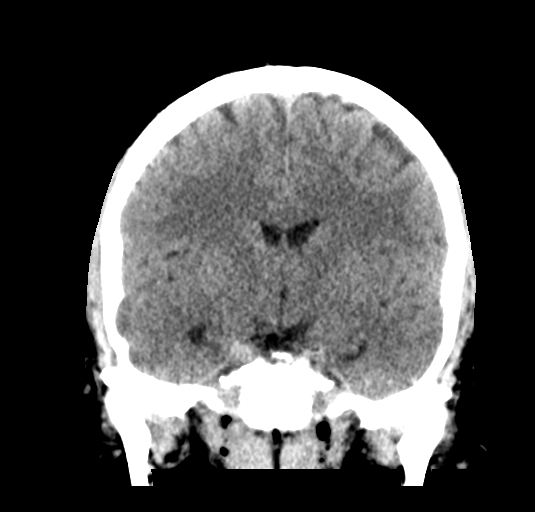

[Series 4: head 3.0 sag st · sagittal · 0.31mm/px · 3 of 51 slices shown]
[im 17/51  brain]
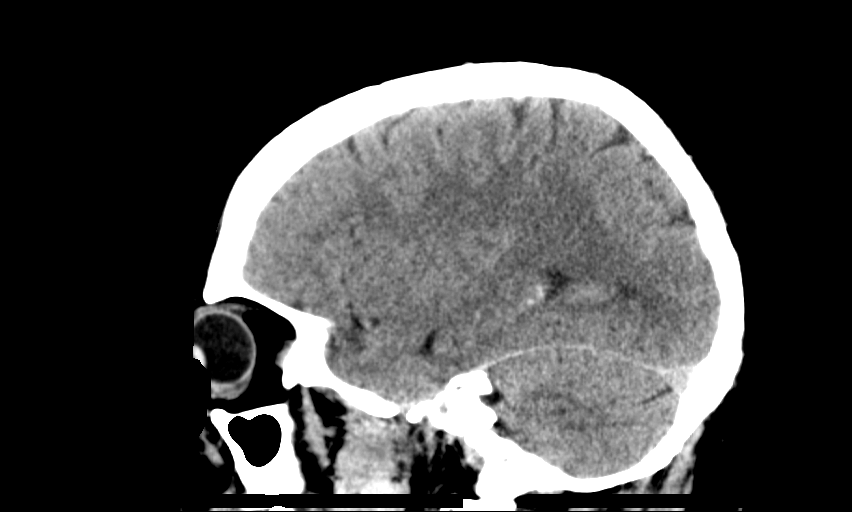
[im 26/51  brain]
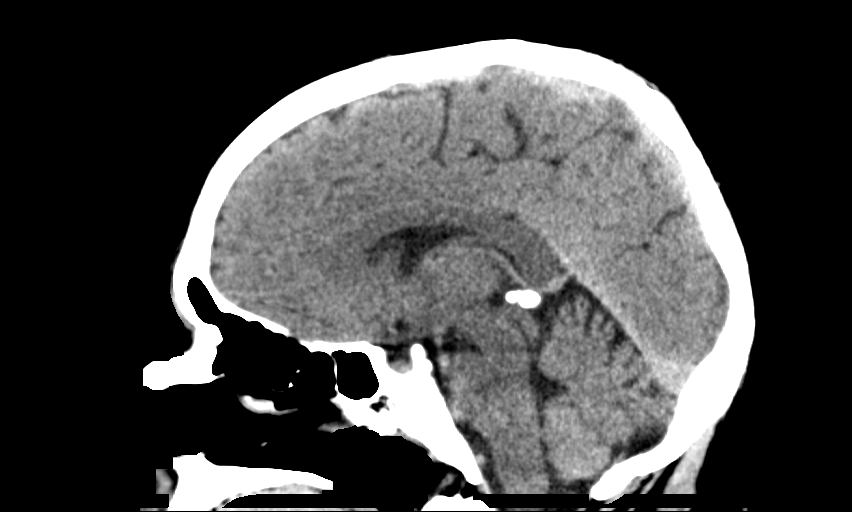
[im 34/51  brain]
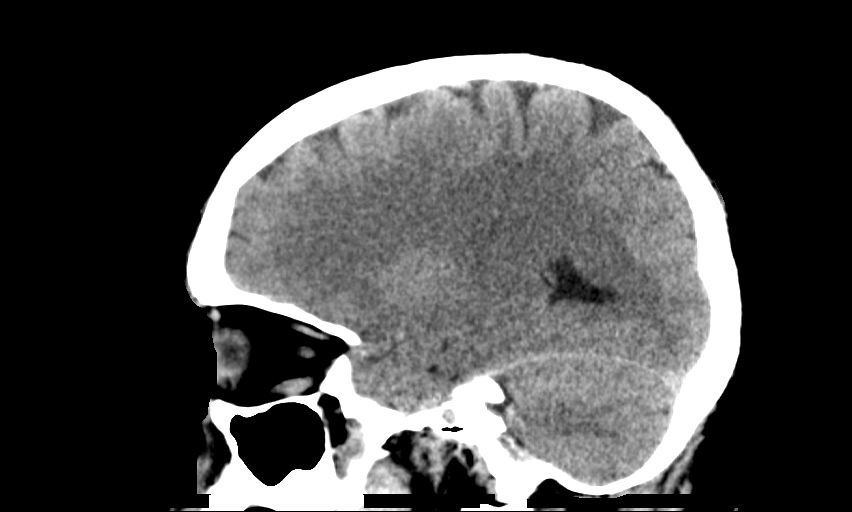

[Series 5: head 5.0 st · axial · 0.42mm/px · z∈[+1793,+1928]mm · 8 of 33 slices shown, 10 images]
[im 3/33  brain]
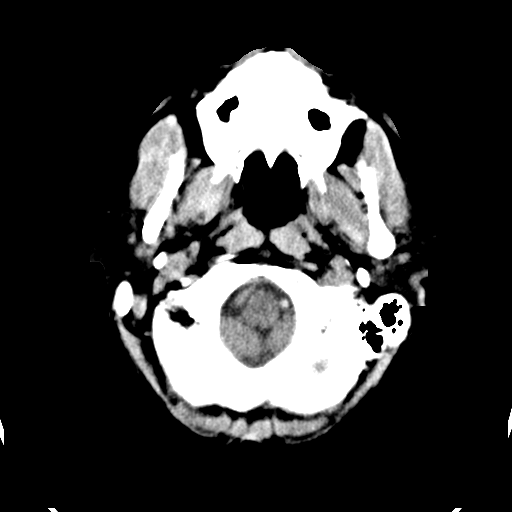
[im 3/33  bone]
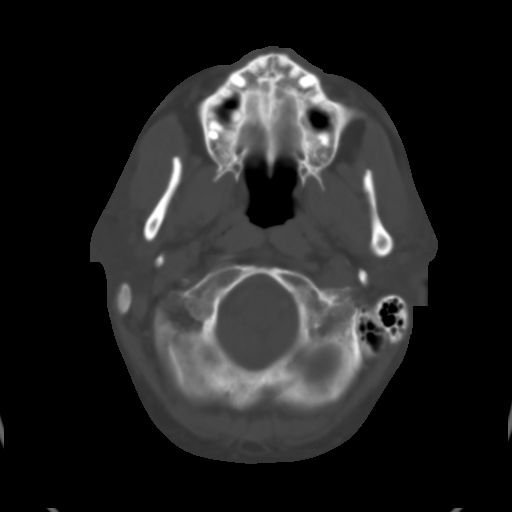
[im 7/33  brain]
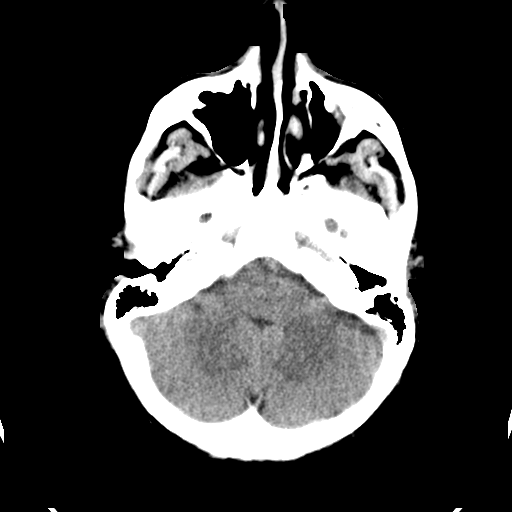
[im 10/33  brain]
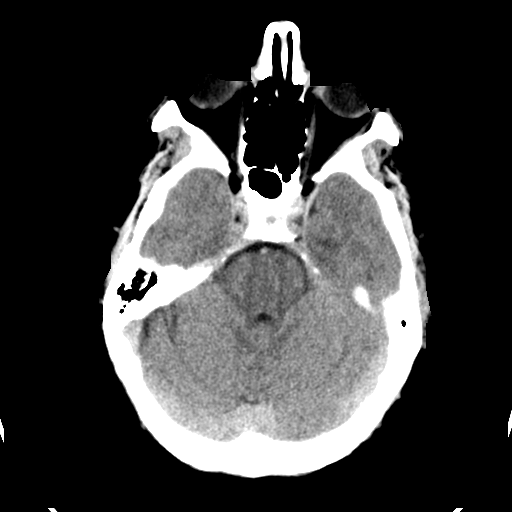
[im 15/33  brain]
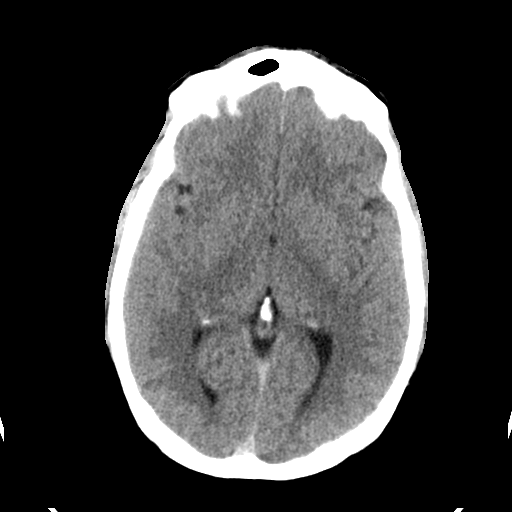
[im 18/33  brain]
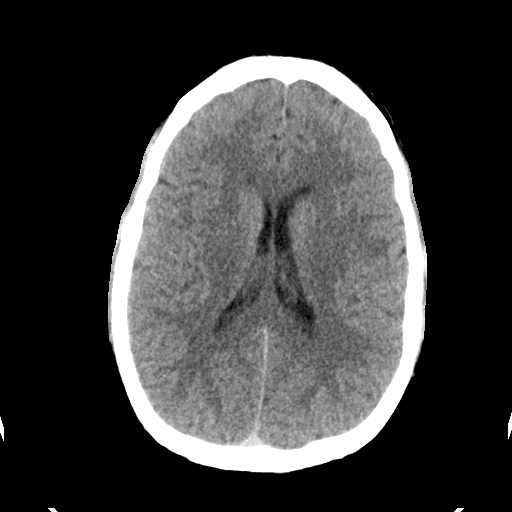
[im 18/33  bone]
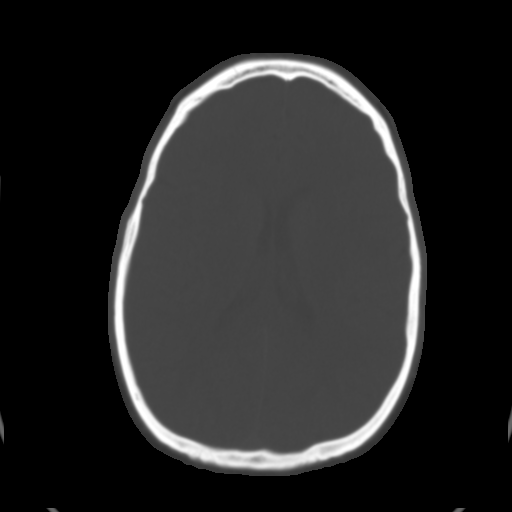
[im 23/33  brain]
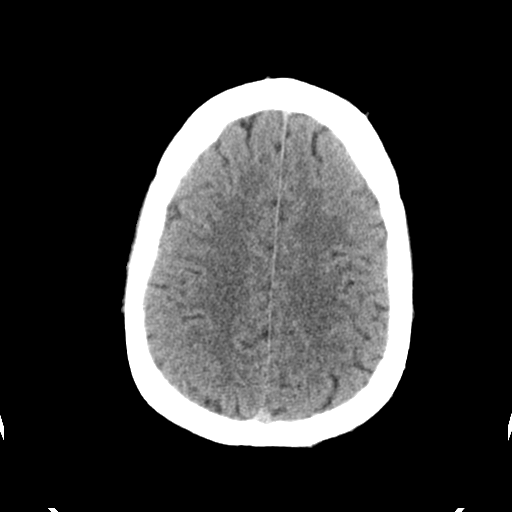
[im 26/33  brain]
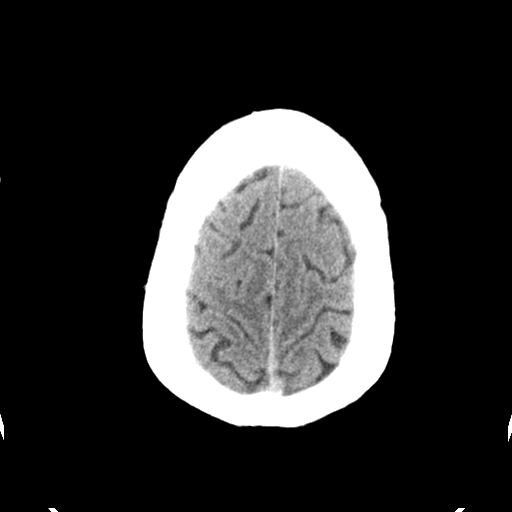
[im 30/33  brain]
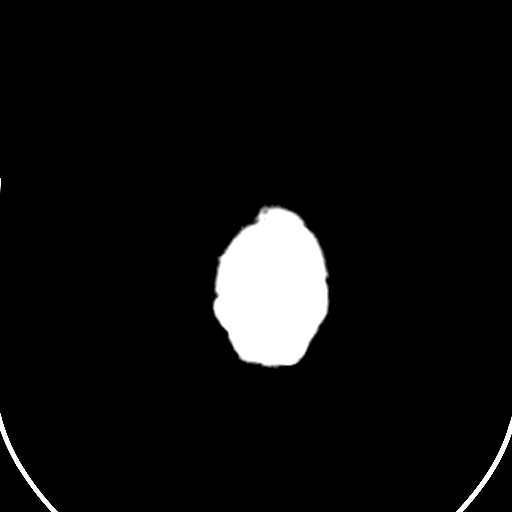

[14 of 47 positions shown; findings below may reference images not displayed]

FINDINGS: Brain: There is no mass, hemorrhage or extra-axial collection. The
size and configuration of the ventricles and extra-axial CSF spaces
are normal. The brain parenchyma is normal, without evidence of
acute or chronic infarction.

Vascular: No abnormal hyperdensity of the major intracranial
arteries or dural venous sinuses. No intracranial atherosclerosis.

Skull: The visualized skull base, calvarium and extracranial soft
tissues are normal.

Sinuses/Orbits: No fluid levels or advanced mucosal thickening of
the visualized paranasal sinuses. No mastoid or middle ear effusion.
The orbits are normal.

ASPECTS (Alberta Stroke Program Early CT Score)

- Ganglionic level infarction (caudate, lentiform nuclei, internal
capsule, insula, M1-M3 cortex): 7

- Supraganglionic infarction (M4-M6 cortex): 3

Total score (0-10 with 10 being normal): 10
IMPRESSION: 1. Normal head CT.
2. ASPECTS is 100

These results were called by telephone at the time of interpretation
on [DATE] at [DATE] to provider DMITRIY , who verbally
acknowledged these results.

## 2021-03-31 NOTE — ED Triage Notes (Signed)
Pt states he had right leg numbness two days ago which resolved. Today pt has had headache since he woke up and left arm numbness that started at approximately 4pm. Pt c/o weakness in left arm, denies dropping anything. Pt c/o tingling in neck as well.

## 2021-03-31 NOTE — ED Provider Notes (Signed)
[facility] EMERGENCY DEPARTMENT Provider Note   CSN: [id number] Arrival date & time: 03/31/21  1913     History Chief Complaint  Patient presents with   arm numbness    Coreon R Nisley is a 55 y.o. male.  HPI  Patient presented to the ED for evaluation of left arm tingling and numbness.  Patient also had a slight headache earlier.  Patient symptoms started sometime after 4 PM.  He states left arm primarily feels funny.  He is not having any definite weakness.  He is not having any trouble in his leg.  No difficulty with slurred speech.  Patient was evaluated on arrival and code stroke was activated.  He was seen by neurology team Dr. Derry Lory.   History reviewed. No pertinent past medical history.  Patient Active Problem List   Diagnosis Date Noted   Cluster headache 04/21/2015   Preventative health care 04/21/2015   Hyperlipidemia 04/21/2015    Past Surgical History:  Procedure Laterality Date   LITHOTRIPSY         Family History  Problem Relation Age of Onset   Cancer Paternal Grandfather        Bone   Cancer Maternal Grandmother        Breast    Social History   Tobacco Use   Smoking status: Never   Smokeless tobacco: Never  Vaping Use   Vaping Use: Never used  Substance Use Topics   Alcohol use: Yes    Alcohol/week: 0.0 standard drinks   Drug use: No    Home Medications Prior to Admission medications   Medication Sig Start Date End Date Taking? Authorizing Provider  oxyCODONE-acetaminophen (ROXICET) 5-325 MG tablet Take 1 tablet by mouth every 6 (six) hours as needed for severe pain. Patient not taking: Reported on 07/19/2015 06/20/15   Doreene Nest, NP    Allergies    Gluten meal and Penicillins  Review of Systems   Review of Systems  All other systems reviewed and are negative.  Physical Exam Updated Vital Signs BP (!) 144/84   Pulse 60   Temp 98.2 F (36.8 C) (Oral)   Resp 14   Ht 1.727 m (5\' 8" )   Wt 79.6  kg   SpO2 99%   BMI 26.68 kg/m   Physical Exam Vitals and nursing note reviewed.  Constitutional:      General: He is not in acute distress.    Appearance: He is well-developed.  HENT:     Head: Normocephalic and atraumatic.     Right Ear: External ear normal.     Left Ear: External ear normal.  Eyes:     General: No scleral icterus.       Right eye: No discharge.        Left eye: No discharge.     Conjunctiva/sclera: Conjunctivae normal.  Neck:     Trachea: No tracheal deviation.  Cardiovascular:     Rate and Rhythm: Normal rate and regular rhythm.  Pulmonary:     Effort: Pulmonary effort is normal. No respiratory distress.     Breath sounds: Normal breath sounds. No stridor. No wheezing or rales.  Abdominal:     General: Bowel sounds are normal. There is no distension.     Palpations: Abdomen is soft.     Tenderness: There is no abdominal tenderness. There is no guarding or rebound.  Musculoskeletal:        General: No swelling, tenderness or deformity.  Cervical back: Neck supple.  Skin:    General: Skin is warm and dry.     Findings: No rash.  Neurological:     General: No focal deficit present.     Mental Status: He is alert and oriented to person, place, and time.     Cranial Nerves: Cranial nerve deficit: no gross deficits.     Sensory: No sensory deficit.     Motor: No abnormal muscle tone or seizure activity.     Coordination: Coordination normal.     Comments: No pronator drift bilateral upper extrem, able to hold both legs off bed for 5 seconds, sensation intact in all extremities, no visual field cuts, no left or right sided neglect, no nystagmus noted, subjective altered sensation of the left upper extremity   Psychiatric:        Mood and Affect: Mood normal.    ED Results / Procedures / Treatments   Labs (all labs ordered are listed, but only abnormal results are displayed) Labs Reviewed  COMPREHENSIVE METABOLIC PANEL - Abnormal; Notable for the  following components:      Result Value   Alkaline Phosphatase 32 (*)    All other components within normal limits  PROTIME-INR  APTT  CBC  DIFFERENTIAL  I-STAT CHEM 8, ED  CBG MONITORING, ED    EKG EKG Interpretation  Date/Time:  Friday March 31 2021 20:20:25 EDT Ventricular Rate:  63 PR Interval:  156 QRS Duration: 102 QT Interval:  402 QTC Calculation: 411 R Axis:   67 Text Interpretation: Normal sinus rhythm Normal ECG Since last tracing rate faster Confirmed by Linwood Dibbles [phone]) on 03/31/2021 8:35:29 PM  Radiology CT HEAD CODE STROKE WO CONTRAST`  Result Date: 03/31/2021 CLINICAL DATA:  Code stroke.  Left arm numbness EXAM: CT HEAD WITHOUT CONTRAST TECHNIQUE: Contiguous axial images were obtained from the base of the skull through the vertex without intravenous contrast. COMPARISON:  None. FINDINGS: Brain: There is no mass, hemorrhage or extra-axial collection. The size and configuration of the ventricles and extra-axial CSF spaces are normal. The brain parenchyma is normal, without evidence of acute or chronic infarction. Vascular: No abnormal hyperdensity of the major intracranial arteries or dural venous sinuses. No intracranial atherosclerosis. Skull: The visualized skull base, calvarium and extracranial soft tissues are normal. Sinuses/Orbits: No fluid levels or advanced mucosal thickening of the visualized paranasal sinuses. No mastoid or middle ear effusion. The orbits are normal. ASPECTS [facility] Stroke Program Early CT Score) - Ganglionic level infarction (caudate, lentiform nuclei, internal capsule, insula, M1-M3 cortex): 7 - Supraganglionic infarction (M4-M6 cortex): 3 Total score (0-10 with 10 being normal): 10 IMPRESSION: 1. Normal head CT. 2. ASPECTS is 100 These results were called by telephone at the time of interpretation on 03/31/2021 at 8:52 pm to provider Sal Derry Lory , who verbally acknowledged these results. Electronically Signed   By: Deatra Robinson M.D.   On:  03/31/2021 20:53    Procedures Procedures   Medications Ordered in ED Medications - No data to display  ED Course  I have reviewed the triage vital signs and the nursing notes.  Pertinent labs & imaging results that were available during my care of the patient were reviewed by me and considered in my medical decision making (see chart for details).  Clinical Course as of 04/01/21 1345  Fri Mar 31, 2021  2110 Head CT without acute finding. [JK]    Clinical Course User Index [JK] Linwood Dibbles, MD   MDM Rules/Calculators/A&P  Patient evaluated on arrival for possible acute stroke patient not felt to be a tPA candidate as his symptoms are relatively mild right now.  Plan on MRI in the ED of brain and cervical spine.  If negative for stroke, Ok for discharge.  Dr Bebe Shaggy to follow up on MRI which was still pending at shift change.  Final Clinical Impression(s) / ED Diagnoses Pending     Linwood Dibbles, MD 04/01/21 1346

## 2021-03-31 NOTE — ED Notes (Signed)
Unable to aquire CBG because glucose machine was locked

## 2021-03-31 NOTE — Consult Note (Signed)
NEUROLOGY CONSULTATION NOTE   Date of service: March 31, 2021 Patient Name: Dustin ASHMEAD MRN:  [id number] DOB:  1966-04-17 Reason for consult: "L arm numbness" Requesting Provider: Linwood Dibbles, MD _ _ _   _ __   _ __ _ _  __ __   _ __   __ _  History of Present Illness  Dustin Todd is a 55 y.o. male with PMH significant for radiculopathy, headache who presents with  acute onset L arm numbness. He woke up this AM with a dull headache that went away. Around 1600, he had abrupt onset LUE numbness and tingling. Never had anything like this in the past, the symptoms are constant. He currently does not have a headache.  He does not smoke, no marijuana use, no significant alcohol intake.  No history of diabetes, no history of hypertension, no history of hypercholesterolemia. No saddle anesthesia, no lhermittes sign, no bowel or blader incontinence.  mRS: 0 tNK: Not offered, symptoms are mild. Thrombectomy: not offered, mild symptoms. NIHSS components Score: Comment  1a Level of Conscious 0[x]  1[]  2[]  3[]      1b LOC Questions 0[x]  1[]  2[]       1c LOC Commands 0[x]  1[]  2[]       2 Best Gaze 0[x]  1[]  2[]       3 Visual 0[x]  1[]  2[]  3[]      4 Facial Palsy 0[x]  1[]  2[]  3[]      5a Motor Arm - left 0[x]  1[]  2[]  3[]  4[]  UN[]    5b Motor Arm - Right 0[x]  1[]  2[]  3[]  4[]  UN[]    6a Motor Leg - Left 0[x]  1[]  2[]  3[]  4[]  UN[]    6b Motor Leg - Right 0[x]  1[]  2[]  3[]  4[]  UN[]    7 Limb Ataxia 0[x]  1[]  2[]  3[]  UN[]     8 Sensory 0[]  1[x]  2[]  UN[]      9 Best Language 0[x]  1[]  2[]  3[]      10 Dysarthria 0[x]  1[]  2[]  UN[]      11 Extinct. and Inattention 0[x]  1[]  2[]       TOTAL: 1      ROS   Constitutional Denies weight loss, fever and chills.   HEENT Denies changes in vision and hearing.   Respiratory Denies SOB and cough.   CV Denies palpitations and CP   GI Denies abdominal pain, nausea, vomiting and diarrhea.   GU Denies dysuria and urinary frequency.   MSK Denies myalgia and joint pain.    Skin Denies rash and pruritus.   Neurological Denies headache and syncope.   Psychiatric Denies recent changes in mood. Denies anxiety and depression.    Past History   Past Medical History:  Diagnosis Date   Frequent headaches    Radiculopathy    Past Surgical History:  Procedure Laterality Date   LITHOTRIPSY     LITHOTRIPSY     Family History  Problem Relation Age of Onset   Cancer Paternal Grandfather        Bone   Cancer Maternal Grandmother        Breast   Social History   Socioeconomic History   Marital status: Married    Spouse name: Not on file   Number of children: Not on file   Years of education: Not on file   Highest education level: Not on file  Occupational History   Not on file  Tobacco Use   Smoking status: Never   Smokeless tobacco: Never  Vaping Use   Vaping Use: Never used  Substance and Sexual Activity   Alcohol use: Yes    Alcohol/week: 0.0 standard drinks   Drug use: No   Sexual activity: Not on file  Other Topics Concern   Not on file  Social History Narrative   Married.   2 children (boy and girl).   Works at an [employer]   Enjoys going to [employer], golfing, basketball.   Social Determinants of Health   Financial Resource Strain: Not on file  Food Insecurity: Not on file  Transportation Needs: Not on file  Physical Activity: Not on file  Stress: Not on file  Social Connections: Not on file   Allergies  Allergen Reactions   Gluten Meal Other (See Comments)    Break outs, stomach upset   Penicillins Rash    Medications  (Not in a hospital admission)    Vitals   Vitals:   03/31/21 2057 03/31/21 2100 03/31/21 2115 03/31/21 2130  BP:  (!) 142/103 (!) 144/84 128/83  Pulse:  (!) 59 60 (!) 59  Resp:  14 14 15   Temp:      TempSrc:      SpO2:  100% 99% 99%  Weight: 79.6 kg     Height: 5\' 8"  (1.727 m)        Body mass index is 26.68 kg/m.  Physical Exam   General: Laying comfortably in bed; in no acute  distress.  HENT: Normal oropharynx and mucosa. Normal external appearance of ears and nose.  Neck: Supple, no pain or tenderness  CV: No JVD. No peripheral edema.  Pulmonary: Symmetric Chest rise. Normal respiratory effort.  Abdomen: Soft to touch, non-tender.  Ext: No cyanosis, edema, or deformity  Skin: No rash. Normal palpation of skin.   Musculoskeletal: Normal digits and nails by inspection. No clubbing.   Neurologic Examination  Mental status/Cognition: Alert, oriented to self, place, month and year, good attention.  Speech/language: Fluent, comprehension intact, object naming intact, repetition intact.  Cranial nerves:   CN II Pupils equal and reactive to light, no VF deficits    CN III,IV,VI EOM intact, no gaze preference or deviation, no nystagmus    CN V normal sensation in V1, V2, and V3 segments bilaterally    CN VII no asymmetry, no nasolabial fold flattening    CN VIII normal hearing to speech    CN IX & X normal palatal elevation, no uvular deviation    CN XI 5/5 head turn and 5/5 shoulder shrug bilaterally    CN XII midline tongue protrusion    Motor:  Muscle bulk: normal, tone normal, pronator drift none tremor none Mvmt Root Nerve  Muscle Right Left Comments  SA C5/6 Ax Deltoid 5 5   EF C5/6 Mc Biceps 5 5   EE C6/7/8 Rad Triceps 5 5   WF C6/7 Med FCR     WE C7/8 PIN ECU     F Ab C8/T1 U ADM/FDI 5 5   HF L1/2/3 Fem Illopsoas 5 5   KE L2/3/4 Fem Quad 5 5   DF L4/5 D Peron Tib Ant 5 5   PF S1/2 Tibial Grc/Sol 5 5    Reflexes:  Right Left Comments  Pectoralis      Biceps (C5/6) 1 1   Brachioradialis (C5/6) 1 1    Triceps (C6/7) 1 1    Patellar (L3/4) 1 1    Achilles (S1)      Hoffman      Plantar     Jaw  jerk    Sensation:  Light touch Decreased to touch in LUE.   Pin prick    Temperature    Vibration   Proprioception    Coordination/Complex Motor:  - Finger to Nose intact BL - Heel to shin intact BL - Rapid alternating movement are normal -  Gait: Deferred.  Labs   CBC:  Recent Labs  Lab 03/31/21 2031 03/31/21 2038  WBC 5.3  --   NEUTROABS 3.3  --   HGB 16.2 16.0  HCT 47.1 47.0  MCV 91.8  --   PLT 168  --     Basic Metabolic Panel:  Lab Results  Component Value Date   NA 139 03/31/2021   K 4.1 03/31/2021   CO2 26 03/31/2021   GLUCOSE 95 03/31/2021   BUN 20 03/31/2021   CREATININE 1.00 03/31/2021   CALCIUM 9.4 03/31/2021   GFRNONAA >60 03/31/2021   GFRAA >60 12/18/2011   Lipid Panel: No results found for: LDLCALC HgbA1c:  Lab Results  Component Value Date   HGBA1C 5.0 04/14/2015   Urine Drug Screen: No results found for: LABOPIA, COCAINSCRNUR, LABBENZ, AMPHETMU, THCU, LABBARB  Alcohol Level No results found for: ETH  CT Head without contrast(personally reviewed): CTH was negative for a large hypodensity concerning for a large territory infarct or hyperdensity concerning for an ICH  MRI Brain and C spine w/o contrast: Pending.  Impression   Ladanian R Huckaba is a 55 y.o. male with PMH significant for radiculopathy, headache who presents with  acute onset L arm numbness. Symptoms are mild with a NIH stroke scale of 1 and he was outside the tPA window at presentation.  Low suspicion for thrombectomy.  We will get MRI brain and MRI cervical spine to evaluate for a small lacunar stroke versus radiculopathy.  Impression: Lacunar stroke vs radiculopathy. Recommendations  MRI Brain and C spine w/o contrast. Further workup pending above. ______________________________________________________________________   Thank you for the opportunity to take part in the care of this patient. If you have any further questions, please contact the neurology consultation attending.  Signed,  Erick Blinks [facility] Pager Number [phone] _ _ _   _ __   _ __ _ _  __ __   _ __   __ _

## 2021-03-31 NOTE — ED Notes (Signed)
Dr. Criss Alvine to see pt, Code stroke called.

## 2021-03-31 NOTE — ED Provider Notes (Signed)
Emergency Medicine Provider Triage Evaluation Note  Dustin Todd , a 55 y.o. male  was evaluated in triage.  Pt complains of left arm and neck tingling. Woke up with headache which has resolved. Last known normal 4pm.   Review of Systems  Positive: Left arm numbness Negative: Weakness, falls, syncope  Physical Exam  BP (!) 155/88 (BP Location: Right Arm)   Pulse (!) 55   Temp 98.2 F (36.8 C) (Oral)   Resp 18   SpO2 100%  Gen:   Awake, no distress   Resp:  Normal effort  MSK:   Moves extremities without difficulty  Other:  Left arm tingling, no pronator drift  Medical Decision Making  Medically screening exam initiated at 8:18 PM.  Appropriate orders placed.  Adebayo R Thumma was informed that the remainder of the evaluation will be completed by another provider, this initial triage assessment does not replace that evaluation, and the importance of remaining in the ED until their evaluation is complete.     Jeanella Flattery 03/31/21 2030    Pricilla Loveless, MD 04/02/21 [phone]

## 2021-03-31 NOTE — ED Notes (Signed)
Patient transported to MRI 

## 2021-04-01 ENCOUNTER — Emergency Department (HOSPITAL_COMMUNITY): Payer: Federal, State, Local not specified - PPO

## 2021-04-01 IMAGING — MR MR CERVICAL SPINE W/O CM
4 of 6 series · 19 of 48 positions shown · non-contrast
Comparison: None.

CLINICAL DATA: Myelopathy, acute or progressive

EXAM:
MRI CERVICAL SPINE WITHOUT CONTRAST
TECHNIQUE: Multiplanar, multisequence MR imaging of the cervical spine was
performed. No intravenous contrast was administered.

[Series 3: T2 · sagittal · 3.0mm · 0.43mm/px · 3 of 16 slices shown (1 of 2)]
[im 1/16]
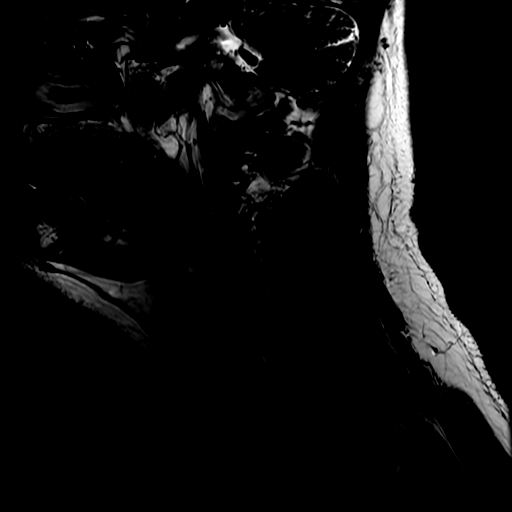
[im 8/16]
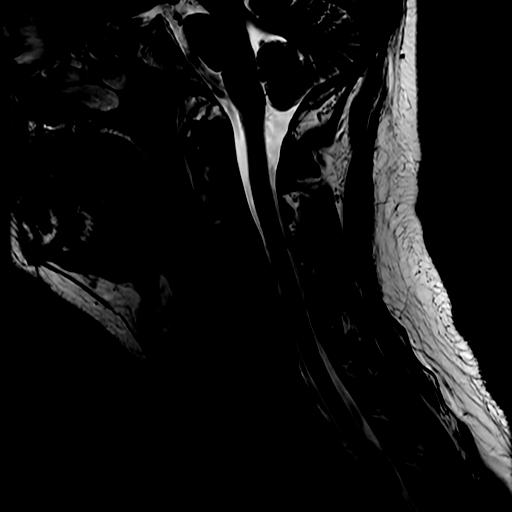
[im 16/16]
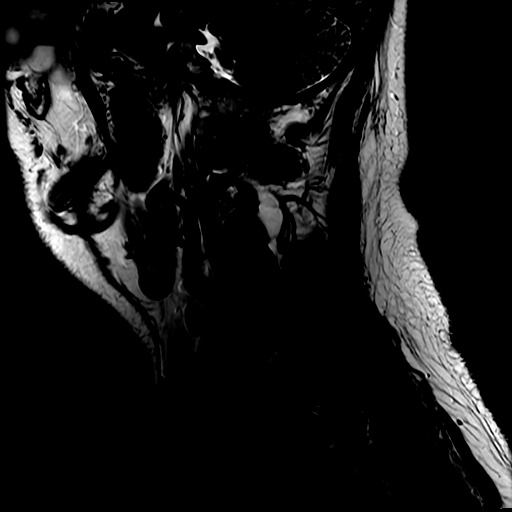

[Series 5: STIR · sagittal · 3.0mm · 0.43mm/px · 3 of 16 slices shown]
[im 1/16]
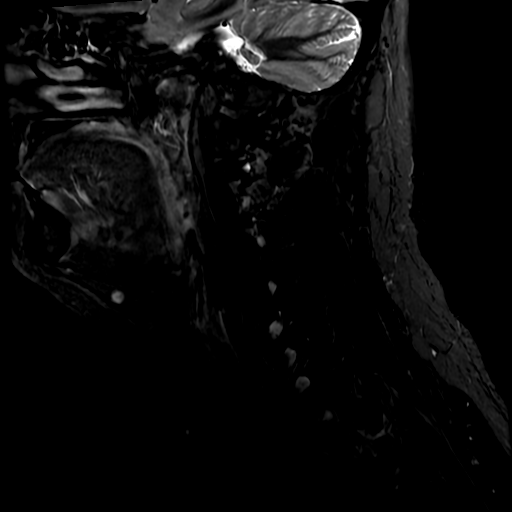
[im 8/16]
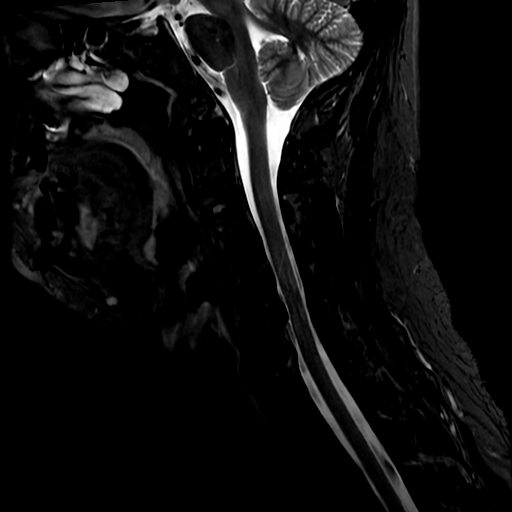
[im 16/16]
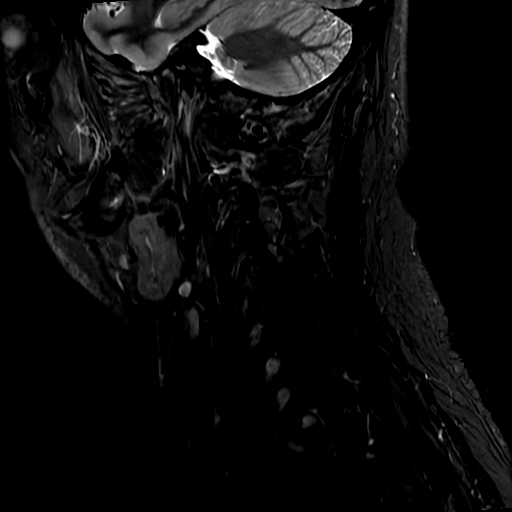

[Series 7: T2 · axial · 3.0mm · 0.35mm/px · z∈[-255,-105]mm · 8 of 48 slices shown (2 of 2)]
[im 1/48]
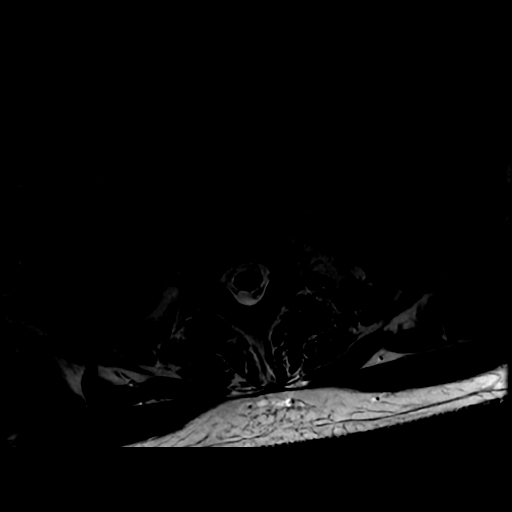
[im 7/48]
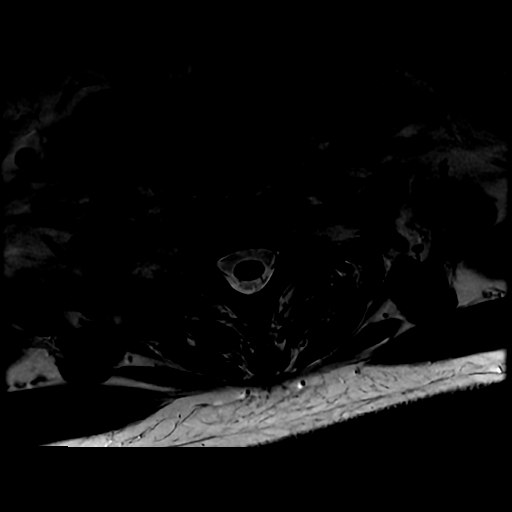
[im 14/48]
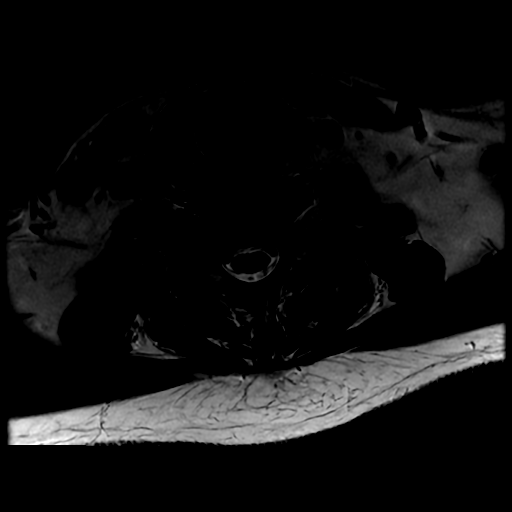
[im 21/48]
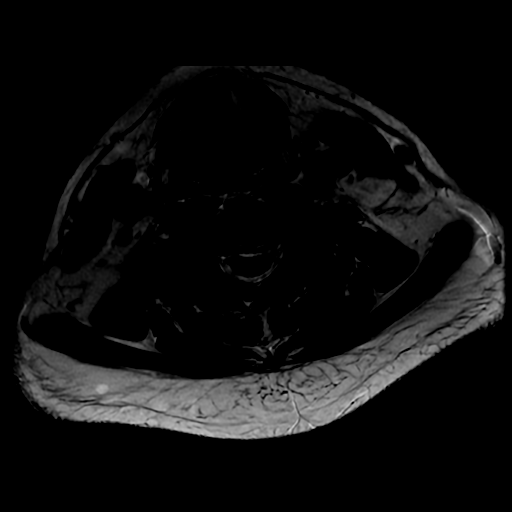
[im 27/48]
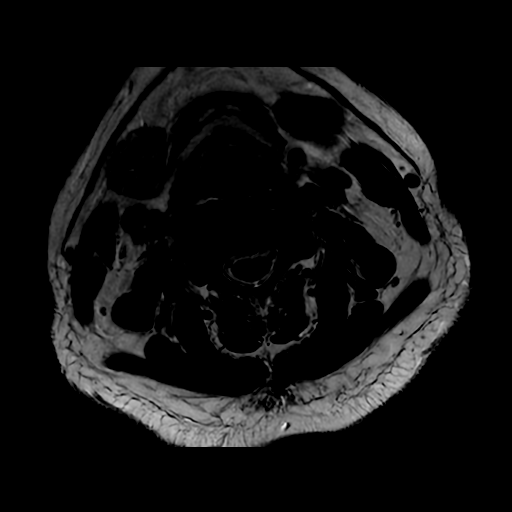
[im 34/48]
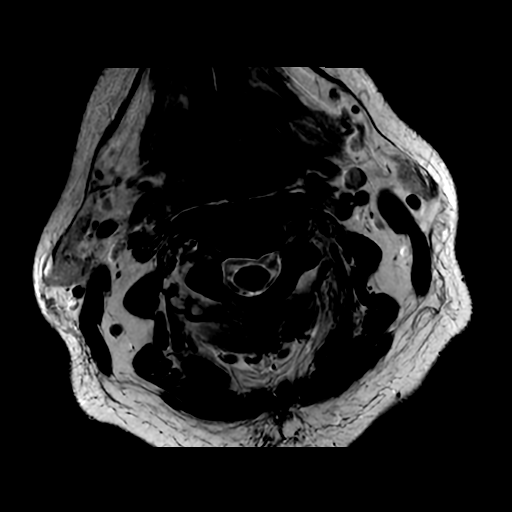
[im 41/48]
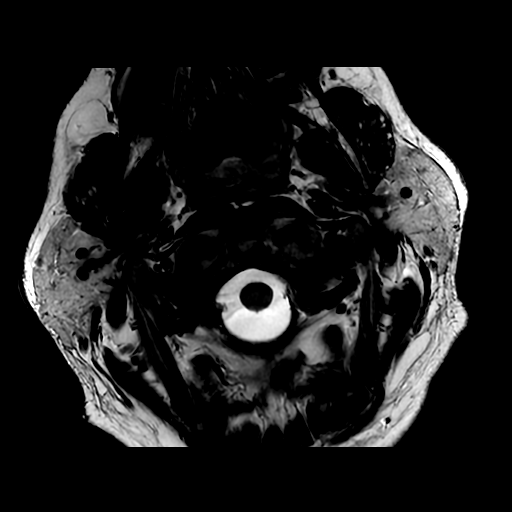
[im 48/48]
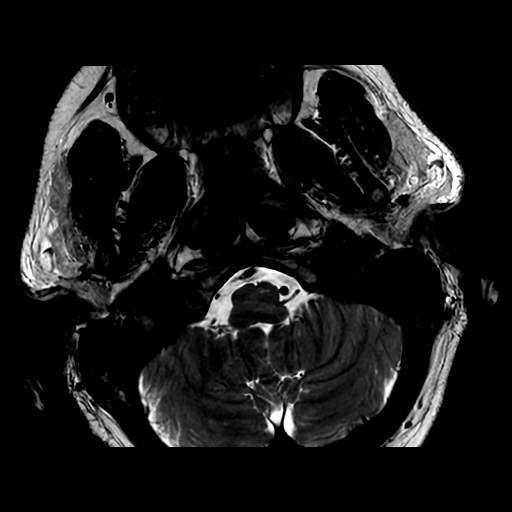

[Series 8: T1 · axial · non-contrast · 3.0mm · 0.35mm/px · z∈[-255,-127]mm · 5 of 48 slices shown]
[im 1/48]
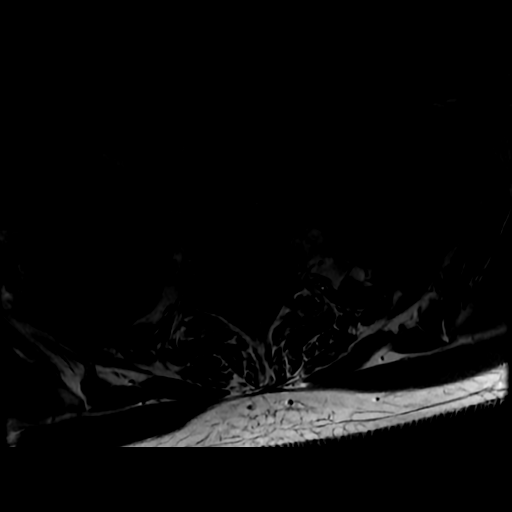
[im 7/48]
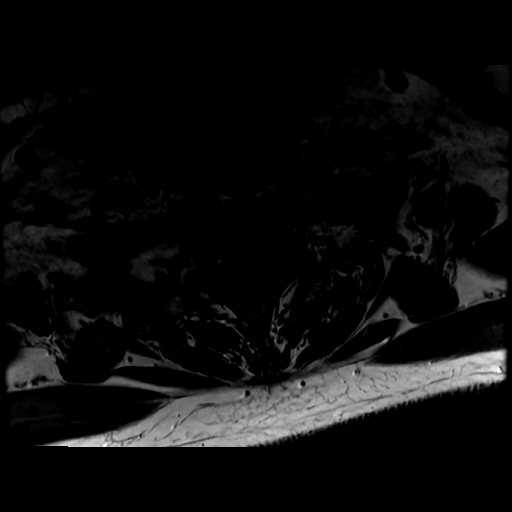
[im 14/48]
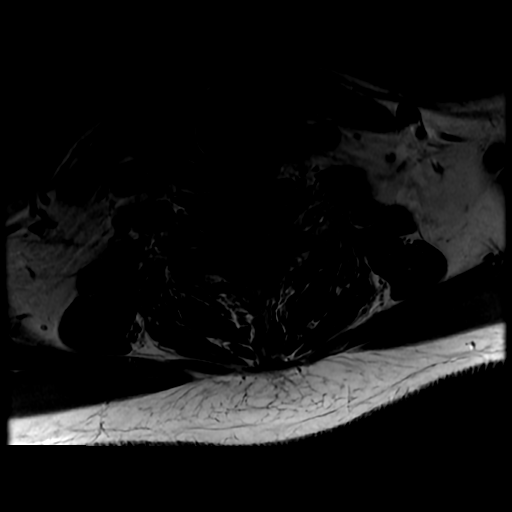
[im 27/48]
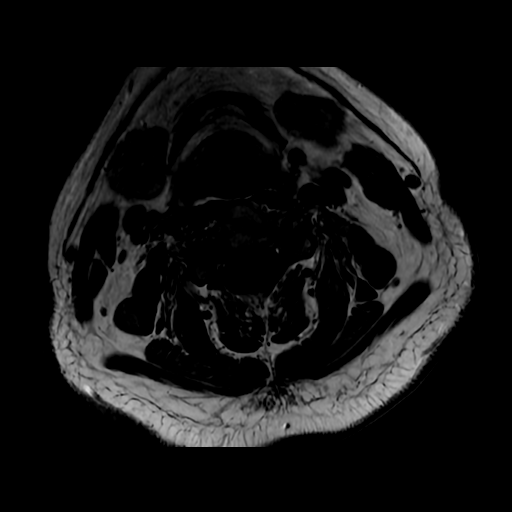
[im 41/48]
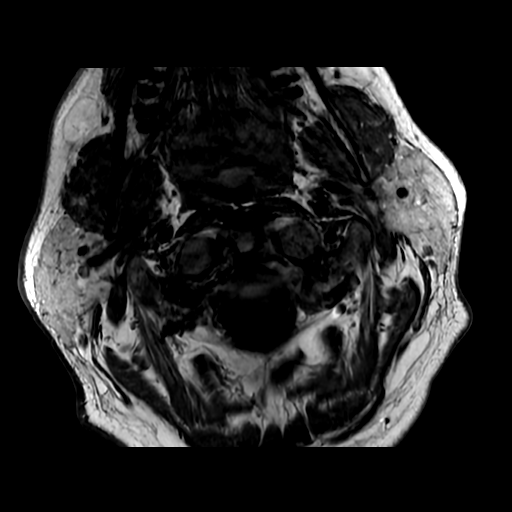

[19 of 48 positions shown; findings below may reference images not displayed]

FINDINGS: Alignment: Straightening of normal cervical lordosis. Grade 1
retrolisthesis at C5-6.

Vertebrae: No fracture, evidence of discitis, or bone lesion.

Cord: Normal signal and morphology.

Posterior Fossa, vertebral arteries, paraspinal tissues: Negative.

Disc levels:

C1-2: Unremarkable.

C2-3: Normal disc space and facet joints. There is no spinal canal
stenosis. No neural foraminal stenosis.

C3-4: Normal disc space and facet joints. There is no spinal canal
stenosis. No neural foraminal stenosis.

C4-5: Normal disc space and facet joints. There is no spinal canal
stenosis. No neural foraminal stenosis.

C5-6: Small disc bulge with uncinate spurring. There is no spinal
canal stenosis. Mild left neural foraminal stenosis.

C6-7: Normal disc space and facet joints. There is no spinal canal
stenosis. No neural foraminal stenosis.

C7-T1: Normal disc space and facet joints. There is no spinal canal
stenosis. No neural foraminal stenosis.
IMPRESSION: 1. No spinal canal stenosis.
2. Mild left C5-6 neural foraminal stenosis.

## 2021-04-01 NOTE — Discharge Instructions (Addendum)

## 2021-04-01 NOTE — ED Notes (Signed)
Back to room at this time.

## 2021-04-01 NOTE — ED Provider Notes (Signed)
I assumed care in signout to follow-up on MRI imaging.  Patient has no signs of acute stroke.  MRI does show some left foraminal stenosis.  Patient reports all of his symptoms is resolved.  He reports he did have dizziness at one point as well as left arm numbness. He is awake and alert, no arm or leg drift.  He has an appropriate mental status Patient has been seen by neurology who feels he is appropriate for discharge home. We will also refer to neurosurgery due to foraminal stenosis in  spine   Zadie Rhine, MD 04/01/21 [phone]

## 2021-04-14 ENCOUNTER — Ambulatory Visit: Payer: Federal, State, Local not specified - PPO | Admitting: Neurology

## 2021-04-14 ENCOUNTER — Encounter: Payer: Self-pay | Admitting: Neurology

## 2021-04-14 VITALS — BP 135/87 | HR 58 | Ht 68.0 in | Wt 175.5 lb

## 2021-04-14 DIAGNOSIS — M5412 Radiculopathy, cervical region: Secondary | ICD-10-CM

## 2021-04-14 NOTE — Patient Instructions (Signed)
Neck exercise as directed  Follow up with your primary care doctor  Return if worse

## 2021-04-14 NOTE — Progress Notes (Signed)
[facility]  PATIENT: Dustin Todd DOB: Nov 06, 1965  REFERRING CLINICIAN: Zadie Rhine, MD HISTORY FROM: Patient   REASON FOR VISIT: Numbness/Dizziness and neck pain   HISTORICAL  CHIEF COMPLAINT:  Chief Complaint  Patient presents with   New Patient (Initial Visit)    Room 12 - alone. ED follow up from left arm numbness and dizziness on 03/31/21. Reports having several dizzy spells prior to going to the hospital. He has not had any further symptoms since that visit.    HISTORY OF PRESENT ILLNESS:  This is a 55 year old male with no past medical history who is presenting for numbness and dizziness.  Patient mentioned that last week he had numbness on the right leg, lasted a couple hours and went away, did not think much about it.  Last Friday he woke up with a slight headache and was having left arm numbness.  During that time also he had a little neck tingling and some dizziness.  He took Ibuprofen, symptoms did not resolved, therefore presented to the ED. In the ED, MRI brain was done which was negative and MRI C-spine which showed mild left C5-C6 neural neuroforaminal stenosis.  Patient said the dizziness resolved and numbness resolved therefore he returned home with neurology follow-up.  He mentioned that this week he has not had any additional symptoms, no dizziness, no numbness, no weakness, and no tingling.  He denies any previous history of the same.  Denies any other complaint.   OTHER MEDICAL CONDITIONS: None    REVIEW OF SYSTEMS: Full 14 system review of systems performed and negative with exception of: as noted in the HPI  ALLERGIES: Allergies  Allergen Reactions   Gluten Meal Other (See Comments)    Break outs, stomach upset   Penicillins Rash    HOME MEDICATIONS: Outpatient Medications Prior to Visit  Medication Sig Dispense Refill   ibuprofen (ADVIL) 200 MG tablet Take 200 mg by mouth every 6 (six) hours as needed for headache.      aspirin EC 81 MG tablet Take 162 mg by mouth.     oxyCODONE-acetaminophen (ROXICET) 5-325 MG tablet Take 1 tablet by mouth every 6 (six) hours as needed for severe pain. (Patient not taking: No sig reported) 20 tablet 0   No facility-administered medications prior to visit.    PAST MEDICAL HISTORY: Past Medical History:  Diagnosis Date   Kidney stone     PAST SURGICAL HISTORY: Past Surgical History:  Procedure Laterality Date   LITHOTRIPSY      FAMILY HISTORY: Family History  Problem Relation Age of Onset   Healthy Mother    Heart disease Father        pacemaker   Cancer Maternal Grandmother        Breast   Cancer Paternal Grandfather        Bone    SOCIAL HISTORY: Social History   Socioeconomic History   Marital status: Married    Spouse name: Not on file   Number of children: 2   Years of education: college   Highest education level: Not on file  Occupational History   Occupation: self employed Architect  Tobacco Use   Smoking status: Never   Smokeless tobacco: Never  Vaping Use   Vaping Use: Never used  Substance and Sexual Activity   Alcohol use: Not Currently    Comment: occasional   Drug use: No   Sexual activity: Not on file  Other Topics Concern   Not on  file  Social History Narrative   Married.   2 children (boy and girl).   Works at an [employer]   Enjoys going to [employer], golfing, basketball.   Right-handed.   One cup caffeine per day.   Social Determinants of Health   Financial Resource Strain: Not on file  Food Insecurity: Not on file  Transportation Needs: Not on file  Physical Activity: Not on file  Stress: Not on file  Social Connections: Not on file  Intimate Partner Violence: Not on file     PHYSICAL EXAM  GENERAL EXAM/CONSTITUTIONAL: Vitals:  Vitals:   04/14/21 0857  BP: 135/87  Pulse: (!) 58  Weight: 175 lb 8 oz (79.6 kg)  Height: 5\' 8"  (1.727 m)   Body mass index is 26.68 kg/m. Wt Readings from Last 3  Encounters:  04/14/21 175 lb 8 oz (79.6 kg)  03/31/21 175 lb 7.8 oz (79.6 kg)  07/19/15 185 lb 6.4 oz (84.1 kg)   Patient is in no distress; well developed, nourished and groomed; neck is supple  CARDIOVASCULAR: Examination of carotid arteries is normal; no carotid bruits Regular rate and rhythm, no murmurs Examination of peripheral vascular system by observation and palpation is normal  EYES: Pupils round and reactive to light, Visual fields full to confrontation, Extraocular movements intacts,   MUSCULOSKELETAL: Gait, strength, tone, movements noted in Neurologic exam below  NEUROLOGIC: MENTAL STATUS:  awake, alert, oriented to person, place and time recent and remote memory intact normal attention and concentration language fluent, comprehension intact, naming intact fund of knowledge appropriate  CRANIAL NERVE:  2nd, 3rd, 4th, 6th - pupils equal and reactive to light, visual fields full to confrontation, extraocular muscles intact, no nystagmus 5th - facial sensation symmetric 7th - facial strength symmetric 8th - hearing intact 9th - palate elevates symmetrically, uvula midline 11th - shoulder shrug symmetric 12th - tongue protrusion midline  MOTOR:  normal bulk and tone, full strength in the BUE, BLE  SENSORY:  normal and symmetric to light touch, pinprick, temperature, vibration  COORDINATION:  finger-nose-finger, fine finger movements normal  REFLEXES:  deep tendon reflexes present and symmetric  GAIT/STATION:  normal   DIAGNOSTIC DATA (LABS, IMAGING, TESTING) - I reviewed patient records, labs, notes, testing and imaging myself where available.  Lab Results  Component Value Date   WBC 5.3 03/31/2021   HGB 16.0 03/31/2021   HCT 47.0 03/31/2021   MCV 91.8 03/31/2021   PLT 168 03/31/2021      Component Value Date/Time   NA 139 03/31/2021 2038   NA 141 12/18/2011 2110   K 4.1 03/31/2021 2038   K 4.1 12/18/2011 2110   CL 102 03/31/2021 2038    CL 106 12/18/2011 2110   CO2 26 03/31/2021 2031   CO2 28 12/18/2011 2110   GLUCOSE 95 03/31/2021 2038   GLUCOSE 87 12/18/2011 2110   BUN 20 03/31/2021 2038   BUN 17 12/18/2011 2110   CREATININE 1.00 03/31/2021 2038   CREATININE 1.12 12/18/2011 2110   CALCIUM 9.4 03/31/2021 2031   CALCIUM 8.8 12/18/2011 2110   PROT 7.1 03/31/2021 2031   PROT 7.1 12/18/2011 2110   ALBUMIN 4.5 03/31/2021 2031   ALBUMIN 4.0 12/18/2011 2110   AST 25 03/31/2021 2031   AST 30 12/18/2011 2110   ALT 26 03/31/2021 2031   ALT 29 12/18/2011 2110   ALKPHOS 32 (L) 03/31/2021 2031   ALKPHOS 38 (L) 12/18/2011 2110   BILITOT 0.9 03/31/2021 2031   BILITOT  0.5 12/18/2011 2110   GFRNONAA >60 03/31/2021 2031   GFRNONAA >60 12/18/2011 2110   GFRAA >60 12/18/2011 2110   Lab Results  Component Value Date   CHOL 258 (H) 04/14/2015   HDL 45.80 04/14/2015   LDLDIRECT 149.0 04/14/2015   TRIG 372.0 (H) 04/14/2015   CHOLHDL 6 04/14/2015   Lab Results  Component Value Date   HGBA1C 5.0 04/14/2015   No results found for: VITAMINB12 No results found for: TSH  MRI Brain 03/31/2021 Normal brain MRI   MRI Cervical Spine9/09/2020:  1. No spinal canal stenosis. 2. Mild left C5-6 neural foraminal stenosis   ASSESSMENT AND PLAN  56 y.o. year old male with no past medical history who is presenting with mild neck pain, right leg numbness and also an episode of left arm numbness.  Patient symptoms resolved.  He did have a MRI brain which was negative and MRI C-spine which showed a mild left C5-6 neural foraminal stenosis.  Based on the symptoms and MRI finding patient likely have cervical radiculopathy.  He currently does not have any symptoms.  I advised for ibuprofen for the pain and neck exercises, information given to patient to do exercise at home.  He can follow-up with his primary care doctor and return if if worse.   1. Cervical radiculopathy     PLAN: Neck exercise as directed  Follow up with your primary  care doctor  Return if worse    No orders of the defined types were placed in this encounter.   No orders of the defined types were placed in this encounter.   Return if symptoms worsen or fail to improve.    Windell Norfolk, MD 04/14/2021, 9:43 AM  [facility] [address], [address] [place], [place] [address] [phone]

## 2023-02-28 ENCOUNTER — Emergency Department (HOSPITAL_COMMUNITY)
Admission: EM | Admit: 2023-02-28 | Discharge: 2023-02-28 | Disposition: A | Discharge status: Home / Self Care | Payer: Federal, State, Local not specified - PPO | Attending: Emergency Medicine | Admitting: Emergency Medicine

## 2023-02-28 ENCOUNTER — Other Ambulatory Visit: Payer: Self-pay

## 2023-02-28 ENCOUNTER — Encounter (HOSPITAL_COMMUNITY): Payer: Self-pay | Admitting: Emergency Medicine

## 2023-02-28 DIAGNOSIS — R040 Epistaxis: Secondary | ICD-10-CM | POA: Insufficient documentation

## 2023-02-28 DIAGNOSIS — I10 Essential (primary) hypertension: Secondary | ICD-10-CM | POA: Insufficient documentation

## 2023-02-28 LAB — COMPREHENSIVE METABOLIC PANEL
ALT: 21 U/L (ref 0–44)
AST: 22 U/L (ref 15–41)
Albumin: 4.1 g/dL (ref 3.5–5.0)
Alkaline Phosphatase: 43 U/L (ref 38–126)
Anion gap: 15 (ref 5–15)
BUN: 14 mg/dL (ref 6–20)
CO2: 20 mmol/L — ABNORMAL LOW (ref 22–32)
Calcium: 9.3 mg/dL (ref 8.9–10.3)
Chloride: 101 mmol/L (ref 98–111)
Creatinine, Ser: 0.96 mg/dL (ref 0.61–1.24)
GFR, Estimated: >60 mL/min (ref >60)
Glucose, Bld: 111 mg/dL — ABNORMAL HIGH (ref 70–99)
Potassium: 3.8 mmol/L (ref 3.5–5.1)
Sodium: 136 mmol/L (ref 135–145)
Total Bilirubin: 0.9 mg/dL (ref 0.3–1.2)
Total Protein: 6.8 g/dL (ref 6.5–8.1)

## 2023-02-28 LAB — CBC
HCT: 44.2 % (ref 39.0–52.0)
Hemoglobin: 15.2 g/dL (ref 13.0–17.0)
MCH: 30.8 pg (ref 26.0–34.0)
MCHC: 34.4 g/dL (ref 30.0–36.0)
MCV: 89.7 fL (ref 80.0–100.0)
Platelets: 178 10*3/uL (ref 150–400)
RBC: 4.93 MIL/uL (ref 4.22–5.81)
RDW: 11.9 % (ref 11.5–15.5)
WBC: 5.7 10*3/uL (ref 4.0–10.5)
nRBC: 0 % (ref 0.0–0.2)

## 2023-02-28 LAB — PROTIME-INR
INR: 0.9 (ref 0.8–1.2)
Prothrombin Time: 12.6 seconds (ref 11.4–15.2)

## 2023-02-28 MED ORDER — CEPHALEXIN 500 MG PO CAPS: 500.0000 mg | ORAL_CAPSULE | Freq: Three times a day (TID) | ORAL | 0 refills | Status: DC

## 2023-02-28 MED ORDER — OXYMETAZOLINE HCL 0.05 % NA SOLN
1.0000 | Freq: Once | NASAL | Status: AC
  Administered 2023-02-28: 1 via NASAL
  Filled 2023-02-28: qty 30

## 2023-02-28 MED ORDER — TRANEXAMIC ACID FOR EPISTAXIS
500.0000 mg | Freq: Once | TOPICAL | Status: AC
  Administered 2023-02-28: 500 mg via TOPICAL
  Filled 2023-02-28: qty 10

## 2023-02-28 MED ORDER — LIDOCAINE-EPINEPHRINE (PF) 2 %-1:200000 IJ SOLN
20.0000 mL | Freq: Once | INTRAMUSCULAR | Status: AC
  Administered 2023-02-28: 20 mL
  Filled 2023-02-28: qty 20

## 2023-02-28 NOTE — ED Notes (Signed)
Bleeding remains controlled at this time; pt denies recent bleeding.

## 2023-02-28 NOTE — ED Notes (Signed)
Evaluated patient after administering afrin and no additional bleeding noted. Instructed patient to alert personnel if bleeding starts again.

## 2023-02-28 NOTE — Discharge Instructions (Addendum)
Keep the packing in place until you follow-up with Dr. Jearld Fenton.  Take the antibiotics as prescribed, do not blow your nose to disturb the packing.  Sleep with head of bed elevated. If bleeding recurs, hold pressure for 30 minutes and return to the ED if bleeding persist after this.

## 2023-02-28 NOTE — ED Provider Notes (Signed)
[place] [facility] AT [facility] Provider Note   CSN: [id number] Arrival date & time: 02/28/23  0005     History  Chief Complaint  Patient presents with   Epistaxis    Dustin Todd is a 57 y.o. male.  Patient presents with intermittent nosebleed for the past several months.  However he has had a nosebleed that has not stopped since 10:30 PM tonight.  Started on the left side but is now on both sides.  States he has had intermittent bleeds over the past several months that he is normally able to control at home with a few minutes of pressure but this is the first time he has had difficulty controlling the bleeding.  Denies taking any blood thinners.  Denies any trauma.  Denies any pain.  No difficulty breathing or difficulty swallowing.  No chest pain or shortness of breath.  Hypertensive on arrival but states no medications for hypertension. He has never been evaluated for nosebleeds previously  The history is provided by the patient and the spouse.  Epistaxis Associated symptoms: no congestion, no cough, no dizziness, no fever and no headaches        Home Medications Prior to Admission medications   Medication Sig Start Date End Date Taking? Authorizing Provider  ibuprofen (ADVIL) 200 MG tablet Take 200 mg by mouth every 6 (six) hours as needed for headache.    [provider]      Allergies    Gluten meal and Penicillins    Review of Systems   Review of Systems  Constitutional:  Negative for activity change, appetite change and fever.  HENT:  Positive for nosebleeds. Negative for congestion.   Respiratory:  Negative for cough, chest tightness and shortness of breath.   Cardiovascular:  Negative for chest pain.  Gastrointestinal:  Negative for abdominal pain, nausea and vomiting.  Genitourinary:  Negative for dysuria and hematuria.  Musculoskeletal:  Negative for arthralgias and myalgias.  Skin:  Negative for rash.  Neurological:   Negative for dizziness, weakness and headaches.    all other systems are negative except as noted in the HPI and PMH.   Physical Exam Updated Vital Signs BP (!) 172/93 (BP Location: Right Arm)   Pulse 63   Temp 98 F (36.7 C)   Resp 18   Ht 5\' 8"  (1.727 m)   Wt 79 kg   SpO2 99%   BMI 26.48 kg/m  Physical Exam Vitals and nursing note reviewed.  Constitutional:      General: He is not in acute distress.    Appearance: He is well-developed.  HENT:     Head: Normocephalic and atraumatic.     Nose:     Comments: Bleeding from left nare with large clots expressed  Trickle of blood in posterior pharynx.     Mouth/Throat:     Pharynx: No oropharyngeal exudate.  Eyes:     Conjunctiva/sclera: Conjunctivae normal.     Pupils: Pupils are equal, round, and reactive to light.  Neck:     Comments: No meningismus. Cardiovascular:     Rate and Rhythm: Normal rate and regular rhythm.     Heart sounds: Normal heart sounds. No murmur heard. Pulmonary:     Effort: Pulmonary effort is normal. No respiratory distress.     Breath sounds: Normal breath sounds.  Abdominal:     Palpations: Abdomen is soft.     Tenderness: There is no abdominal tenderness. There is no guarding or  rebound.  Musculoskeletal:        General: No tenderness. Normal range of motion.     Cervical back: Normal range of motion and neck supple.  Skin:    General: Skin is warm.  Neurological:     Mental Status: He is alert and oriented to person, place, and time.     Cranial Nerves: No cranial nerve deficit.     Motor: No abnormal muscle tone.     Coordination: Coordination normal.     Comments:  5/5 strength throughout. CN 2-12 intact.Equal grip strength.   Psychiatric:        Behavior: Behavior normal.     ED Results / Procedures / Treatments   Labs (all labs ordered are listed, but only abnormal results are displayed) Labs Reviewed  COMPREHENSIVE METABOLIC PANEL - Abnormal; Notable for the following  components:      Result Value   CO2 20 (*)    Glucose, Bld 111 (*)    All other components within normal limits  PROTIME-INR  CBC    EKG None  Radiology No results found.  Procedures .Epistaxis Management  Date/Time: 02/28/2023 5:20 AM  Performed by: Glynn Octave, MD Authorized by: Glynn Octave, MD   Consent:    Consent obtained:  Verbal   Consent given by:  Patient   Risks, benefits, and alternatives were discussed: yes     Risks discussed:  Bleeding, infection, nasal injury and pain   Alternatives discussed:  Alternative treatment Universal protocol:    Procedure explained and questions answered to patient or proxy's satisfaction: yes     Relevant documents present and verified: yes     Immediately prior to procedure, a time out was called: yes     Patient identity confirmed:  Verbally with patient Anesthesia:    Anesthesia method:  Topical application   Topical anesthetic:  Lidocaine gel Procedure details:    Treatment site:  L anterior   Treatment method:  Anterior pack, nasal tampon and thrombin   Treatment complexity:  Extensive   Treatment episode: recurring   Post-procedure details:    Assessment:  Bleeding stopped   Procedure completion:  Tolerated well, no immediate complications     Medications Ordered in ED Medications  lidocaine-EPINEPHrine (XYLOCAINE W/EPI) 2 %-1:200000 (PF) injection 20 mL (has no administration in time range)  tranexamic acid (CYKLOKAPRON) 1000 MG/10ML topical solution 500 mg (has no administration in time range)  oxymetazoline (AFRIN) 0.05 % nasal spray 1 spray (1 spray Each Nare Given 02/28/23 0032)    ED Course/ Medical Decision Making/ A&P                                 Medical Decision Making Amount and/or Complexity of Data Reviewed Labs: ordered. Decision-making details documented in ED Course. Radiology: ordered and independent interpretation performed. Decision-making details documented in ED  Course. ECG/medicine tests: ordered and independent interpretation performed. Decision-making details documented in ED Course.  Risk OTC drugs. Prescription drug management.   Epistaxis.  No anticoagulation use.  No difficulty breathing or difficulty swallowing.  Vital stable.  Patient given topical Afrin as well as TXA.  Large clots expressed  Patient with persistent bleeding after topical therapies.  No visible area that could be cauterized.  Ultimately required packing with 5.5 cm Rhino Rocket.  Patient tolerated well.  Observed in the ED for additional 30 minutes without further bleeding.  Follow-up with ENT.  Keep packing in place.  Take antibiotics as prescribed.  Sleep with head of bed elevated, do not blow nose, do not disturb packing.  If bleeding recurs, hold pressure for 30 minutes before coming back to the ED.  Return to the ED with worsening problems.        Final Clinical Impression(s) / ED Diagnoses Final diagnoses:  Left-sided epistaxis    Rx / DC Orders ED Discharge Orders     None         Candelaria Pies, Jeannett Senior, MD 02/28/23 [phone]

## 2023-02-28 NOTE — ED Notes (Signed)
Pt provided with water to swish and spit per request.

## 2023-02-28 NOTE — ED Triage Notes (Signed)
Patient with nose bleed that started at 1040 tonight and hasn't stopped since. Patient not on blood thinners. York Spaniel this has been a recurrent issue over the last few months but usually able to get them to stop tonight he is unable to. Denies pain. Denies trauma. States it spontaneously occurred.

## 2023-02-28 NOTE — ED Notes (Signed)
Pt provided with AVS.  Education complete; all questions answered.  Pt leaving ED in stable condition at this time, ambulatory with all belongings. 

## 2023-02-28 NOTE — ED Notes (Signed)
Pt moved to ED 33 per provider request for room.

## 2023-03-05 ENCOUNTER — Encounter (INDEPENDENT_AMBULATORY_CARE_PROVIDER_SITE_OTHER): Payer: Self-pay | Admitting: Otolaryngology

## 2023-03-05 ENCOUNTER — Ambulatory Visit (INDEPENDENT_AMBULATORY_CARE_PROVIDER_SITE_OTHER): Payer: Federal, State, Local not specified - PPO | Admitting: Otolaryngology

## 2023-03-05 VITALS — BP 128/81 | HR 66 | Ht 68.0 in | Wt 176.0 lb

## 2023-03-05 DIAGNOSIS — R0981 Nasal congestion: Secondary | ICD-10-CM

## 2023-03-05 DIAGNOSIS — R040 Epistaxis: Secondary | ICD-10-CM | POA: Diagnosis not present

## 2023-03-05 DIAGNOSIS — J343 Hypertrophy of nasal turbinates: Secondary | ICD-10-CM | POA: Diagnosis not present

## 2023-03-05 DIAGNOSIS — J3489 Other specified disorders of nose and nasal sinuses: Secondary | ICD-10-CM | POA: Diagnosis not present

## 2023-03-05 DIAGNOSIS — J3089 Other allergic rhinitis: Secondary | ICD-10-CM

## 2023-03-05 DIAGNOSIS — J339 Nasal polyp, unspecified: Secondary | ICD-10-CM

## 2023-03-05 DIAGNOSIS — J342 Deviated nasal septum: Secondary | ICD-10-CM

## 2023-03-05 MED ORDER — OXYMETAZOLINE HCL 0.05 % NA SOLN: 1.0000 | Freq: Two times a day (BID) | NASAL | 0 refills | Status: DC

## 2023-03-05 MED ORDER — SALINE SPRAY 0.65 % NA SOLN: 1.0000 | NASAL | 0 refills | Status: DC | PRN

## 2023-03-05 NOTE — Progress Notes (Signed)
ENT CONSULT:  Reason for Consult: left sided epistaxis s/p packing placement 5 days ago   HPI: Dustin Todd is an 57 y.o. male with hx of cluster headaches, here for recurrent epistaxis, and a recent episode of left-sided epistaxis which required ED visit. He had Rhino Rocket placed on the left side 5 days ago. He was sent home on Advil and Keflex. This controlled the bleeding and he was d/c'ed home with ENT f/u. He reports low-volume self-resolving epistaxis in the past on and off, mostly from the left side. He reports nasal congestion.   Records Reviewed:  ED documentation 02/28/23 Patient presents with intermittent nosebleed for the past several months.  However he has had a nosebleed that has not stopped since 10:30 PM tonight.  Started on the left side but is now on both sides.  States he has had intermittent bleeds over the past several months that he is normally able to control at home with a few minutes of pressure but this is the first time he has had difficulty controlling the bleeding.  Denies taking any blood thinners.  Denies any trauma.  Denies any pain.  No difficulty breathing or difficulty swallowing.  No chest pain or shortness of breath.  Hypertensive on arrival but states no medications for hypertension. He has never been evaluated for nosebleeds previously  Given Afrin, TXA and rhino rocket was placed.      Past Medical History:  Diagnosis Date   Kidney stone     Past Surgical History:  Procedure Laterality Date   LITHOTRIPSY      Family History  Problem Relation Age of Onset   Healthy Mother    Heart disease Father        pacemaker   Cancer Maternal Grandmother        Breast   Cancer Paternal Grandfather        Bone    Social History:  reports that he has never smoked. He has never used smokeless tobacco. He reports that he does not currently use alcohol. He reports that he does not use drugs.  Allergies:  Allergies  Allergen Reactions   Gluten Meal  Other (See Comments)    Break outs, stomach upset   Penicillins Rash    Medications: I have reviewed the patient's current medications.  The PMH, PSH, Medications, Allergies, and SH were reviewed and updated.  ROS: Constitutional: Negative for fever, weight loss and weight gain. Cardiovascular: Negative for chest pain and dyspnea on exertion. Respiratory: Is not experiencing shortness of breath at rest. Gastrointestinal: Negative for nausea and vomiting. Neurological: Negative for headaches. Psychiatric: The patient is not nervous/anxious  Blood pressure 128/81, pulse 66, height 5\' 8"  (1.727 m), weight 176 lb (79.8 kg), SpO2 98%.  PHYSICAL EXAM:  Exam: General: Well-developed, well-nourished Respiratory Respiratory effort: Equal inspiration and expiration without stridor Cardiovascular Peripheral Vascular: Warm extremities with equal color/perfusion Eyes: No nystagmus with equal extraocular motion bilaterally Neuro/Psych/Balance: Patient oriented to person, place, and time; Appropriate mood and affect; Gait is intact with no imbalance; Cranial nerves I-XII are intact Head and Face Inspection: Normocephalic and atraumatic without mass or lesion Palpation: Facial skeleton intact without bony stepoffs Salivary Glands: No mass or tenderness Facial Strength: Facial motility symmetric and full bilaterally ENT Pinna: External ear intact and fully developed External canal: Canal is patent with intact skin Tympanic Membrane: Clear and mobile External Nose: No scar or anatomic deformity Internal Nose: Septum is deviated. Rhino Rocket on the left, removed. Mucosal  edema and erythema present. Area of mucosal irritation along the anterior septum and inferior turbinate (likely source of bleeding and/or irritated by the placement of nasal packing), polyp vs mass along the medial aspect of the middle turbinate, purple discoloration, possible ecchymosis of the nasal polyp vs vascular lesion.  Minimal oozing along the anterior septum and inferior turbinate, packed with surgifoam/surgicel taco. No residual bleeding  Bilateral inferior turbinate hypertrophy.  Lips, Teeth, and gums: Mucosa and teeth intact and viable TMJ: No pain to palpation with full mobility Oral cavity/oropharynx: No erythema or exudate, no lesions present. No blood or clot.  Nasopharynx: No mass or lesion with intact mucosa Neck Neck and Trachea: Midline trachea without mass or lesion Thyroid: No mass or nodularity Lymphatics: No lymphadenopathy  Procedure:   PROCEDURE NOTE: nasal endoscopy  Preoperative diagnosis: recurrent epistaxis, nasal congestion  Postoperative diagnosis: same  Procedure: Diagnostic nasal endoscopy (32671)  Surgeon: Ashok Croon, M.D.  Anesthesia: Topical lidocaine and Afrin  H&P REVIEW: The patient's history and physical were reviewed today prior to procedure. All medications were reviewed and updated as well. Complications: None Condition is stable throughout exam Indications and consent: The patient presents with symptoms of chronic sinusitis not responding to previous therapies. All the risks, benefits, and potential complications were reviewed with the patient preoperatively and informed consent was obtained. The time out was completed with confirmation of the correct procedure.   Procedure: The patient was seated upright in the clinic. Rhino Rocket was removed from the left side. Topical lidocaine and Afrin were applied to the nasal cavity. After adequate anesthesia had occurred, the rigid nasal endoscope was passed into the nasal cavity. The nasal mucosa, turbinates, septum, and sinus drainage pathways were visualized bilaterally. This revealed no purulence or significant secretions that might be cultured. There was evidence of a nasal polyp with red discoloration vs nasal mass at the medial aspect of the left middle turbinate. The mucosa was intact and there was no  crusting present, but there was minimal oozing along inferior turbinate and anterior septum on the left.. The scope was then slowly withdrawn and the patient tolerated the procedure well. There were no complications or blood loss.   PROCEDURE NOTE: left sided epistaxis control with packing anterior   Preoperative diagnosis: epistaxis, left side  Postoperative diagnosis: same   Procedure:  control of nasal hemorrhage (24580)  Surgeon: Ashok Croon, M.D.  Anesthesia: Topical lidocaine and Afrin  H&P REVIEW: The patient's history and physical were reviewed today prior to procedure. All medications were reviewed and updated as well. Complications: None Condition is stable throughout exam Indications and consent: The patient presents with symptoms of chronic sinusitis not responding to previous therapies. All the risks, benefits, and potential complications were reviewed with the patient preoperatively and informed consent was obtained. The time out was completed with confirmation of the correct procedure.   Procedure: The patient was seated upright in the clinic. Topical lidocaine and Afrin were applied to the nasal cavity after removal of the nasal packing. After adequate anesthesia had occurred. The nasal mucosa, turbinates, septum, and sinus drainage pathways were visualized bilaterally.  There was no blood or clot in posterior oropharynx. The anterior epistaxis on the left was noted on initial bilateral nasal endoscopy, and did not resolve after pressure, and we proceeded to place dissolvable nasal packing using Surgifoam and Surgicel on the left side. Patient tolerated procedure well and had good hemostasis at the end of the procedure.     Studies Reviewed:CT head  and MRI brain done 03/2021 - personally reviewed images - S-shaped septum and ITH, no masses or lesions, and paranasal sinuses are clear.    FINDINGS: Brain: No acute infarct, mass effect or extra-axial collection. No acute  or chronic hemorrhage. Normal white matter signal, parenchymal volume and CSF spaces. The midline structures are normal.   Vascular: Major flow voids are preserved.   Skull and upper cervical spine: Normal calvarium and skull base. Visualized upper cervical spine and soft tissues are normal.   Sinuses/Orbits:No paranasal sinus fluid levels or advanced mucosal thickening. No mastoid or middle ear effusion. Normal orbits.   IMPRESSION: Normal brain MRI   Narrative & Impression  CLINICAL DATA:  Code stroke.  Left arm numbness   EXAM: CT HEAD WITHOUT CONTRAST   TECHNIQUE: Contiguous axial images were obtained from the base of the skull through the vertex without intravenous contrast.   COMPARISON:  None.   FINDINGS: Brain: There is no mass, hemorrhage or extra-axial collection. The size and configuration of the ventricles and extra-axial CSF spaces are normal. The brain parenchyma is normal, without evidence of acute or chronic infarction.   Vascular: No abnormal hyperdensity of the major intracranial arteries or dural venous sinuses. No intracranial atherosclerosis.   Skull: The visualized skull base, calvarium and extracranial soft tissues are normal.   Sinuses/Orbits: No fluid levels or advanced mucosal thickening of the visualized paranasal sinuses. No mastoid or middle ear effusion. The orbits are normal.   ASPECTS [facility] Stroke Program Early CT Score)   - Ganglionic level infarction (caudate, lentiform nuclei, internal capsule, insula, M1-M3 cortex): 7   - Supraganglionic infarction (M4-M6 cortex): 3   Total score (0-10 with 10 being normal): 10   IMPRESSION: 1. Normal head CT.      Assessment/Plan: Encounter Diagnoses  Name Primary?   Epistaxis Yes   Nasal congestion    Nasal mass    Nasal polyp    Nasal septal deviation    Hypertrophy of inferior nasal turbinate    Environmental and seasonal allergies     57 year old male with history of  cluster type headaches and low-volume recurrent anterior epistaxis that he was able to control with anterior pressure on his own, who is here for initial evaluation following a visit to the ER for significant left-sided epistaxis which required placement of a Rhino Rocket after Afrin and TXA application failed to control the nosebleed.  He denies history of untreated hypertension, does not take blood thinners, but reports taking baby aspirin and no other supplements on board that could contribute to risk of nosebleeds.  Left-sided Rhino Rocket placed in ED 5 days ago, with adequate control of epistaxis no further epistaxis per report.  On my exam today after Rhino Rocket removal there was an area along left-sided anterior septum and inferior turbinate anteriorly that appeared to have very small amount of oozing likely related to placement of a Rhino Rocket versus source of bleeding for the initial episode of epistaxis that required the packing.  There was also evidence of significant nasal congestion mucosal edema and inferior turbinate hypertrophy.  Of note there was also evidence of either nasal polyp or structures somewhat resembling nasal mass which was purple in discoloration, and appeared different from a blood clot, present on the medial aspect of the left middle turbinate.  I personally reviewed his MRI brain and CT of the head done in 2022, and there is no evidence of a nasal mass in the area of concern.  He  had septal deviation and inferior turbinate hypertrophy notable on imaging done in September 2022.  We placed dissolvable packing on the left side to help prevent recurrent epistaxis following packing removal today.  I discussed at length the strategies to prevent epistaxis in the future and control it at home.  We will plan to have patient obtain CT max face with contrast to better evaluate nasal anatomy and rule out nasal mass after dissolvable packing has been dissolved in a few weeks.  He will  return after imaging to discuss results.  If nasal mass is indeed detected on repeat nasal endoscopy and imaging, we will consider biopsy as long as the structure is not vascular can be done in the office under direct visualization local anesthesia.  All questions have been answered.   - nasal saline and Vaseline both nares BID - CT Max face with contrast in 3-4 weeks to evaluate nasal anatomy and rule out nasal mass - repeat nasal endoscopy when he returns  - RTC after imaging (instructed the patient to obtain CT scan after a few weeks to allow dissolvable nasal packing to dissolve) - ok to stop Keflex (Rhino Rocket has been removed)  Epistaxis prevention instructions given to the patient: - use nasal saline spray x6/day and Vaseline twice a day to prevent nose bleeds - for active nose bleeds use Afrin and nasal tip pressure x 10 min to stop them - if nose bleed does not stop with above measures, please go to Emergency Room  - please see your primary care provider to check your blood pressure and make sure it is under control - return for recurrent nose bleeds and we will consider cautery of your nasal blood vessels   Thank you for allowing me to participate in the care of this patient. Please do not hesitate to contact me with any questions or concerns.   Ashok Croon, MD Otolaryngology [facility] Phone: [phone] Fax: [phone]    03/05/2023, 4:46 PM

## 2023-03-05 NOTE — Patient Instructions (Addendum)
Epistaxis prevention instructions given to the patient: - use nasal saline spray x6/day and Vaseline twice a day to prevent nose bleeds - for active nose bleeds use Afrin and nasal tip pressure x 10 min to stop them - if nose bleed does not stop with above measures, please go to Emergency Room  - please see your primary care provider to check your blood pressure and make sure it is under control - return for recurrent nose bleeds and we will consider cautery of your nasal blood vessels

## 2023-03-18 ENCOUNTER — Ambulatory Visit (HOSPITAL_BASED_OUTPATIENT_CLINIC_OR_DEPARTMENT_OTHER)
Admission: RE | Admit: 2023-03-18 | Discharge: 2023-03-18 | Disposition: A | Discharge status: Home / Self Care | Payer: Federal, State, Local not specified - PPO | Source: Ambulatory Visit | Attending: Otolaryngology | Admitting: Otolaryngology

## 2023-03-18 DIAGNOSIS — R040 Epistaxis: Secondary | ICD-10-CM

## 2023-03-18 DIAGNOSIS — J3489 Other specified disorders of nose and nasal sinuses: Secondary | ICD-10-CM | POA: Diagnosis present

## 2023-03-18 DIAGNOSIS — R0981 Nasal congestion: Secondary | ICD-10-CM | POA: Diagnosis present

## 2023-03-18 MED ORDER — IOHEXOL 300 MG/ML  SOLN
100.0000 mL | Freq: Once | INTRAMUSCULAR | Status: AC | PRN
  Administered 2023-03-18: 75 mL via INTRAVENOUS

## 2023-04-10 ENCOUNTER — Telehealth: Payer: Self-pay

## 2023-04-10 NOTE — Telephone Encounter (Signed)
Error

## 2023-04-11 ENCOUNTER — Ambulatory Visit (INDEPENDENT_AMBULATORY_CARE_PROVIDER_SITE_OTHER): Payer: Federal, State, Local not specified - PPO | Admitting: Otolaryngology

## 2023-04-11 ENCOUNTER — Other Ambulatory Visit (INDEPENDENT_AMBULATORY_CARE_PROVIDER_SITE_OTHER): Payer: Self-pay | Admitting: Otolaryngology

## 2023-04-11 ENCOUNTER — Encounter (INDEPENDENT_AMBULATORY_CARE_PROVIDER_SITE_OTHER): Payer: Self-pay | Admitting: Otolaryngology

## 2023-04-11 VITALS — BP 147/86 | HR 67 | Ht 68.0 in | Wt 184.0 lb

## 2023-04-11 DIAGNOSIS — R040 Epistaxis: Secondary | ICD-10-CM | POA: Diagnosis not present

## 2023-04-11 DIAGNOSIS — J3089 Other allergic rhinitis: Secondary | ICD-10-CM | POA: Diagnosis not present

## 2023-04-11 DIAGNOSIS — J3489 Other specified disorders of nose and nasal sinuses: Secondary | ICD-10-CM | POA: Diagnosis not present

## 2023-04-11 DIAGNOSIS — R0981 Nasal congestion: Secondary | ICD-10-CM | POA: Diagnosis not present

## 2023-04-11 DIAGNOSIS — J339 Nasal polyp, unspecified: Secondary | ICD-10-CM

## 2023-04-11 DIAGNOSIS — J342 Deviated nasal septum: Secondary | ICD-10-CM

## 2023-04-11 DIAGNOSIS — J343 Hypertrophy of nasal turbinates: Secondary | ICD-10-CM | POA: Diagnosis not present

## 2023-04-11 MED ORDER — DESLORATADINE 5 MG PO TABS: 5.0000 mg | ORAL_TABLET | Freq: Every day | ORAL | 3 refills | Status: DC

## 2023-04-11 MED ORDER — AZELASTINE HCL 0.1 % NA SOLN: 2.0000 | Freq: Two times a day (BID) | NASAL | 12 refills | Status: DC

## 2023-04-11 NOTE — Progress Notes (Signed)
ENT Progress Note   Update 04/12/23: He returns after CT of the sinuses max face CT, no significant epistaxis since last office visit, but had mild bleeding from left side.  Nasal congestion is slightly better but he continues to have trouble breathing through his nose.  No nasal packing since last office visit.  Initial evaluation 03/05/2023:  Reason for Consult: left sided epistaxis s/p packing placement 5 days ago   HPI: Jaquese R Cogan is an 57 y.o. male with hx of cluster headaches, here for recurrent epistaxis, and a recent episode of left-sided epistaxis which required ED visit. He had Rhino Rocket placed on the left side 5 days ago. He was sent home on Advil and Keflex. This controlled the bleeding and he was d/c'ed home with ENT f/u. He reports low-volume self-resolving epistaxis in the past on and off, mostly from the left side. He reports nasal congestion.   Records Reviewed:  ED documentation 02/28/23 Patient presents with intermittent nosebleed for the past several months.  However he has had a nosebleed that has not stopped since 10:30 PM tonight.  Started on the left side but is now on both sides.  States he has had intermittent bleeds over the past several months that he is normally able to control at home with a few minutes of pressure but this is the first time he has had difficulty controlling the bleeding.  Denies taking any blood thinners.  Denies any trauma.  Denies any pain.  No difficulty breathing or difficulty swallowing.  No chest pain or shortness of breath.  Hypertensive on arrival but states no medications for hypertension. He has never been evaluated for nosebleeds previously  Given Afrin, TXA and rhino rocket was placed.      Past Medical History:  Diagnosis Date   Kidney stone     Past Surgical History:  Procedure Laterality Date   LITHOTRIPSY      Family History  Problem Relation Age of Onset   Healthy Mother    Heart disease Father        pacemaker    Cancer Maternal Grandmother        Breast   Cancer Paternal Grandfather        Bone    Social History:  reports that he has never smoked. He has never used smokeless tobacco. He reports that he does not currently use alcohol. He reports that he does not use drugs.  Allergies:  Allergies  Allergen Reactions   Gluten Meal Other (See Comments)    Break outs, stomach upset   Penicillins Rash    Medications: I have reviewed the patient's current medications.  The PMH, PSH, Medications, Allergies, and SH were reviewed and updated.  ROS: Constitutional: Negative for fever, weight loss and weight gain. Cardiovascular: Negative for chest pain and dyspnea on exertion. Respiratory: Is not experiencing shortness of breath at rest. Gastrointestinal: Negative for nausea and vomiting. Neurological: Negative for headaches. Psychiatric: The patient is not nervous/anxious  Blood pressure (!) 147/86, pulse 67, height 5\' 8"  (1.727 m), weight 184 lb (83.5 kg), SpO2 95%.  PHYSICAL EXAM:  Exam: General: Well-developed, well-nourished Respiratory Respiratory effort: Equal inspiration and expiration without stridor Cardiovascular Peripheral Vascular: Warm extremities with equal color/perfusion Eyes: No nystagmus with equal extraocular motion bilaterally Neuro/Psych/Balance: Patient oriented to person, place, and time; Appropriate mood and affect; Gait is intact with no imbalance; Cranial nerves I-XII are intact Head and Face Inspection: Normocephalic and atraumatic without mass or lesion Palpation: Facial skeleton  intact without bony stepoffs Salivary Glands: No mass or tenderness Facial Strength: Facial motility symmetric and full bilaterally ENT Pinna: External ear intact and fully developed External canal: Canal is patent with intact skin Tympanic Membrane: Clear and mobile External Nose: No scar or anatomic deformity Internal Nose: Septum is deviated to the left with right sided spur  resulting in S shaped septum. Mucosal edema and erythema present.  Nasal polyp along the medial aspect of the middle turbinate, appearance on exam today is more consistent with nasal polyps due to allergic inflammation relative to last exam Minimal oozing along the anterior/mid septum on the left, likely source of recurrent epistaxis, bilateral inferior turbinate hypertrophy.  Lips, Teeth, and gums: Mucosa and teeth intact and viable TMJ: No pain to palpation with full mobility Oral cavity/oropharynx: No erythema or exudate, no lesions present. No blood or clot.  Nasopharynx: No mass or lesion with intact mucosa Neck Neck and Trachea: Midline trachea without mass or lesion Thyroid: No mass or nodularity Lymphatics: No lymphadenopathy  Procedure:   PROCEDURE NOTE: nasal endoscopy  Preoperative diagnosis: recurrent epistaxis, nasal congestion and symptoms of nasal obstruction, nasal polyp left side  Postoperative diagnosis: same  Procedure: Diagnostic nasal endoscopy (78295)  Surgeon: Ashok Croon, M.D.  Anesthesia: Topical lidocaine and Afrin  H&P REVIEW: The patient's history and physical were reviewed today prior to procedure. All medications were reviewed and updated as well. Complications: None Condition is stable throughout exam Indications and consent: The patient presents with symptoms of chronic sinusitis not responding to previous therapies. All the risks, benefits, and potential complications were reviewed with the patient preoperatively and informed consent was obtained. The time out was completed with confirmation of the correct procedure.   Procedure: The patient was seated upright in the clinic. Rhino Rocket was removed from the left side. Topical lidocaine and Afrin were applied to the nasal cavity. After adequate anesthesia had occurred, the rigid nasal endoscope was passed into the nasal cavity. The nasal mucosa, turbinates, septum, and sinus drainage pathways were  visualized bilaterally. This revealed no purulence or significant secretions that might be cultured. There was evidence of a nasal polyp at the medial aspect of the left middle turbinate. The mucosa was intact and there was no crusting present, but there was minimal oozing along the septum on the left. The scope was then slowly withdrawn and the patient tolerated the procedure well. There were no complications or blood loss.    Studies Reviewed:CT head and MRI brain done 03/2021 - personally reviewed images - S-shaped septum and ITH, no masses or lesions, and paranasal sinuses are clear.     CT max face 03/18/23 FINDINGS: Osseous: No evidence of fracture or bone lesion. No destructive changes   Orbits: Normal   Sinuses: No paranasal sinus mass or focal mucosal thickening/fluid level. The nasal septum is deviated to the left anteriorly. Along the left inferior turbinate posteriorly and along the anterior middle turbinate there is opacification between the turbinate mucosa and nasal septum without discrete mass.   Soft tissues: No evidence of mass or inflammation.   Limited intracranial: Unremarkable   IMPRESSION: Two small areas of contact between the nasal septum and the left inferior, middle turbinates which could relate to mucous, adhesion, or small polyp. No aggressive/destructive changes.    Assessment/Plan: Encounter Diagnoses  Name Primary?   Nasal obstruction Yes   Nasal septal deviation    Nasal congestion    Epistaxis    Nasal polyp    Environmental  and seasonal allergies    Hypertrophy of inferior nasal turbinate      57 year old male with history of cluster type headaches and low-volume recurrent anterior epistaxis that he was able to control with anterior pressure on his own, who is here for initial evaluation following a visit to the ER for significant left-sided epistaxis which required placement of a Rhino Rocket after Afrin and TXA application failed to  control the nosebleed.  He denies history of untreated hypertension, does not take blood thinners, but reports taking baby aspirin and no other supplements on board that could contribute to risk of nosebleeds.  Left-sided Rhino Rocket placed in ED 5 days ago, with adequate control of epistaxis no further epistaxis per report.  On my exam today after Rhino Rocket removal there was an area along left-sided anterior septum and inferior turbinate anteriorly that appeared to have very small amount of oozing likely related to placement of a Rhino Rocket versus source of bleeding for the initial episode of epistaxis that required the packing.  There was also evidence of significant nasal congestion mucosal edema and inferior turbinate hypertrophy.  Of note there was also evidence of either nasal polyp or structures somewhat resembling nasal mass which was purple in discoloration, and appeared different from a blood clot, present on the medial aspect of the left middle turbinate.  I personally reviewed his MRI brain and CT of the head done in 2022, and there is no evidence of a nasal mass in the area of concern.  He had septal deviation and inferior turbinate hypertrophy notable on imaging done in September 2022.  We placed dissolvable packing on the left side to help prevent recurrent epistaxis following packing removal today.  I discussed at length the strategies to prevent epistaxis in the future and control it at home.  We will plan to have patient obtain CT max face with contrast to better evaluate nasal anatomy and rule out nasal mass after dissolvable packing has been dissolved in a few weeks.  He will return after imaging to discuss results.  If nasal mass is indeed detected on repeat nasal endoscopy and imaging, we will consider biopsy as long as the structure is not vascular can be done in the office under direct visualization local anesthesia.  All questions have been answered.   - nasal saline and Vaseline  both nares BID - CT Max face with contrast in 3-4 weeks to evaluate nasal anatomy and rule out nasal mass - repeat nasal endoscopy when he returns  - RTC after imaging (instructed the patient to obtain CT scan after a few weeks to allow dissolvable nasal packing to dissolve) - ok to stop Keflex (Rhino Rocket has been removed)  Epistaxis prevention instructions given to the patient: - use nasal saline spray x6/day and Vaseline twice a day to prevent nose bleeds - for active nose bleeds use Afrin and nasal tip pressure x 10 min to stop them - if nose bleed does not stop with above measures, please go to Emergency Room  - please see your primary care provider to check your blood pressure and make sure it is under control - return for recurrent nose bleeds and we will consider cautery of your nasal blood vessels   Update 04/12/23  Epistaxis, chronic recurrent -appears to be doing somewhat better following initiation of epistaxis prevention care, but continues to have low-volume bleeding from the left side intermittently.  Repeat nasal endoscopy today demonstrated that the source of bleeding is along  the septal deviation and septal spur on the left near the level of mid septum.  Another potential source of bleeding could be left-sided nasal polyp, located near that site, with evidence of dilated blood vessels supplying the lesion. -Will schedule for septo/ITR and removal of nasal polyp -Continue epistaxis prevention instructions outlined above  2.  Nasal mass versus polyp with symptoms of nasal obstruction, nasal congestion, and evidence of septal deviation, inferior turbinate hypertrophy, left-sided nasal polyp on exam and imaging -I reviewed CT max face findings with the patient, and there were no concerning features of the left nasal polyp to suspect malignancy or destructive process, however there was evidence of anatomic features that can result in significant nasal obstruction such as inferior  turbinate hypertrophy, septal deviation, nasal polyp on the left -He has tried over-the-counter Flonase in the past without relief of his nasal congestion symptoms -I discussed with the patient that environmental allergies are one of the major factors that resulted in nasal congestion and nasal polyps.  I also discussed that anatomical risk factors for him to have nasal blockage include septal deviation inferior turbinate hypertrophy -Will continue medical management of symptoms and schedule for septo/ITR removal of nasal polyp  -I discussed with the patient that surgical removal of left-sided nasal polyp and septoplasty will help with epistaxis control as well  Ashok Croon, MD Otolaryngology [facility] Phone: [phone] Fax: [phone]    04/12/2023, 5:13 AM

## 2023-04-11 NOTE — Patient Instructions (Signed)
-   Clarinex and Azelastine  - we will schedule your surgery for nasal polyp and nasal septum  Epistaxis prevention instructions given to the patient: - use nasal saline spray x6/day and Vaseline twice a day to prevent nose bleeds - for active nose bleeds use Afrin and nasal tip pressure x 10 min to stop them - if nose bleed does not stop with above measures, please go to Emergency Room  - please see your primary care provider to check your blood pressure and make sure it is under control - return for recurrent nose bleeds and we will consider cautery of your nasal blood vessels  - Purchase BleedStop to have at home and help with epistaxis control

## 2023-04-18 ENCOUNTER — Other Ambulatory Visit (INDEPENDENT_AMBULATORY_CARE_PROVIDER_SITE_OTHER): Payer: Self-pay

## 2023-04-18 MED ORDER — CETIRIZINE HCL 10 MG PO TABS: 10.0000 mg | ORAL_TABLET | Freq: Every day | ORAL | 11 refills | Status: AC

## 2023-11-17 ENCOUNTER — Inpatient Hospital Stay (HOSPITAL_COMMUNITY)

## 2023-11-17 ENCOUNTER — Encounter (HOSPITAL_COMMUNITY)
Admission: EM | Disposition: A | Discharge status: Home / Self Care | Payer: Self-pay | Source: Home / Self Care | Attending: Internal Medicine

## 2023-11-17 ENCOUNTER — Inpatient Hospital Stay (HOSPITAL_COMMUNITY): Admitting: Anesthesiology

## 2023-11-17 ENCOUNTER — Other Ambulatory Visit: Payer: Self-pay

## 2023-11-17 ENCOUNTER — Inpatient Hospital Stay (HOSPITAL_BASED_OUTPATIENT_CLINIC_OR_DEPARTMENT_OTHER)
Admission: EM | Admit: 2023-11-17 | Discharge: 2023-11-20 | DRG: 660 | Disposition: A | Discharge status: Home / Self Care | Attending: Internal Medicine | Admitting: Internal Medicine

## 2023-11-17 ENCOUNTER — Encounter (HOSPITAL_BASED_OUTPATIENT_CLINIC_OR_DEPARTMENT_OTHER): Payer: Self-pay

## 2023-11-17 ENCOUNTER — Emergency Department (HOSPITAL_BASED_OUTPATIENT_CLINIC_OR_DEPARTMENT_OTHER)

## 2023-11-17 DIAGNOSIS — E875 Hyperkalemia: Principal | ICD-10-CM

## 2023-11-17 DIAGNOSIS — N136 Pyonephrosis: Secondary | ICD-10-CM | POA: Diagnosis present

## 2023-11-17 DIAGNOSIS — Z803 Family history of malignant neoplasm of breast: Secondary | ICD-10-CM

## 2023-11-17 DIAGNOSIS — Z91048 Other nonmedicinal substance allergy status: Secondary | ICD-10-CM

## 2023-11-17 DIAGNOSIS — N179 Acute kidney failure, unspecified: Secondary | ICD-10-CM | POA: Diagnosis present

## 2023-11-17 DIAGNOSIS — R310 Gross hematuria: Secondary | ICD-10-CM | POA: Diagnosis present

## 2023-11-17 DIAGNOSIS — N400 Enlarged prostate without lower urinary tract symptoms: Secondary | ICD-10-CM | POA: Diagnosis present

## 2023-11-17 DIAGNOSIS — Z79899 Other long term (current) drug therapy: Secondary | ICD-10-CM | POA: Diagnosis not present

## 2023-11-17 DIAGNOSIS — N202 Calculus of kidney with calculus of ureter: Secondary | ICD-10-CM

## 2023-11-17 DIAGNOSIS — Z87442 Personal history of urinary calculi: Secondary | ICD-10-CM

## 2023-11-17 DIAGNOSIS — Z880 Allergy status to penicillin: Secondary | ICD-10-CM

## 2023-11-17 DIAGNOSIS — Z808 Family history of malignant neoplasm of other organs or systems: Secondary | ICD-10-CM

## 2023-11-17 DIAGNOSIS — I159 Secondary hypertension, unspecified: Secondary | ICD-10-CM

## 2023-11-17 DIAGNOSIS — E872 Acidosis, unspecified: Secondary | ICD-10-CM | POA: Diagnosis present

## 2023-11-17 DIAGNOSIS — R140 Abdominal distension (gaseous): Secondary | ICD-10-CM

## 2023-11-17 DIAGNOSIS — Z8249 Family history of ischemic heart disease and other diseases of the circulatory system: Secondary | ICD-10-CM

## 2023-11-17 DIAGNOSIS — R319 Hematuria, unspecified: Secondary | ICD-10-CM

## 2023-11-17 DIAGNOSIS — E871 Hypo-osmolality and hyponatremia: Secondary | ICD-10-CM | POA: Diagnosis present

## 2023-11-17 HISTORY — PX: CYSTOSCOPY WITH STENT PLACEMENT: SHX5790

## 2023-11-17 LAB — POCT I-STAT, CHEM 8
BUN: 47 mg/dL — ABNORMAL HIGH (ref 6–20)
Calcium, Ion: 1.06 mmol/L — ABNORMAL LOW (ref 1.15–1.40)
Chloride: 101 mmol/L (ref 98–111)
Creatinine, Ser: 7.1 mg/dL — ABNORMAL HIGH (ref 0.61–1.24)
Glucose, Bld: 88 mg/dL (ref 70–99)
HCT: 35 % — ABNORMAL LOW (ref 39.0–52.0)
Hemoglobin: 11.9 g/dL — ABNORMAL LOW (ref 13.0–17.0)
Potassium: 4.8 mmol/L (ref 3.5–5.1)
Sodium: 129 mmol/L — ABNORMAL LOW (ref 135–145)
TCO2: 19 mmol/L — ABNORMAL LOW (ref 22–32)

## 2023-11-17 LAB — CBC WITH DIFFERENTIAL/PLATELET
Abs Immature Granulocytes: 0.02 10*3/uL (ref 0.00–0.07)
Basophils Absolute: 0 10*3/uL (ref 0.0–0.1)
Basophils Relative: 0 %
Eosinophils Absolute: 0.1 10*3/uL (ref 0.0–0.5)
Eosinophils Relative: 1 %
HCT: 36.9 % — ABNORMAL LOW (ref 39.0–52.0)
Hemoglobin: 13.1 g/dL (ref 13.0–17.0)
Immature Granulocytes: 0 %
Lymphocytes Relative: 13 %
Lymphs Abs: 0.9 10*3/uL (ref 0.7–4.0)
MCH: 30.6 pg (ref 26.0–34.0)
MCHC: 35.5 g/dL (ref 30.0–36.0)
MCV: 86.2 fL (ref 80.0–100.0)
Monocytes Absolute: 0.6 10*3/uL (ref 0.1–1.0)
Monocytes Relative: 9 %
Neutro Abs: 5.5 10*3/uL (ref 1.7–7.7)
Neutrophils Relative %: 77 %
Platelets: 156 10*3/uL (ref 150–400)
RBC: 4.28 MIL/uL (ref 4.22–5.81)
RDW: 11.2 % — ABNORMAL LOW (ref 11.5–15.5)
WBC: 7.1 10*3/uL (ref 4.0–10.5)
nRBC: 0 % (ref 0.0–0.2)

## 2023-11-17 LAB — URINALYSIS, ROUTINE W REFLEX MICROSCOPIC
Bacteria, UA: NONE SEEN
Bilirubin Urine: NEGATIVE
Glucose, UA: NEGATIVE mg/dL
Ketones, ur: NEGATIVE mg/dL
Nitrite: NEGATIVE
Protein, ur: NEGATIVE mg/dL
Specific Gravity, Urine: 1.005 (ref 1.005–1.030)
pH: 6.5 (ref 5.0–8.0)

## 2023-11-17 LAB — COMPREHENSIVE METABOLIC PANEL WITH GFR
ALT: 12 U/L (ref 0–44)
AST: 11 U/L — ABNORMAL LOW (ref 15–41)
Albumin: 3.9 g/dL (ref 3.5–5.0)
Alkaline Phosphatase: 50 U/L (ref 38–126)
Anion gap: 11 (ref 5–15)
BUN: 54 mg/dL — ABNORMAL HIGH (ref 6–20)
CO2: 21 mmol/L — ABNORMAL LOW (ref 22–32)
Calcium: 8.8 mg/dL — ABNORMAL LOW (ref 8.9–10.3)
Chloride: 95 mmol/L — ABNORMAL LOW (ref 98–111)
Creatinine, Ser: 6.31 mg/dL — ABNORMAL HIGH (ref 0.61–1.24)
GFR, Estimated: 10 mL/min — ABNORMAL LOW (ref >60)
Glucose, Bld: 92 mg/dL (ref 70–99)
Potassium: 5.4 mmol/L — ABNORMAL HIGH (ref 3.5–5.1)
Sodium: 127 mmol/L — ABNORMAL LOW (ref 135–145)
Total Bilirubin: 0.9 mg/dL (ref 0.0–1.2)
Total Protein: 6.6 g/dL (ref 6.5–8.1)

## 2023-11-17 LAB — PHOSPHORUS: Phosphorus: 5.3 mg/dL — ABNORMAL HIGH (ref 2.5–4.6)

## 2023-11-17 LAB — NA AND K (SODIUM & POTASSIUM), RAND UR
Potassium Urine: 13 mmol/L
Sodium, Ur: 18 mmol/L

## 2023-11-17 LAB — LIPASE, BLOOD: Lipase: 21 U/L (ref 11–51)

## 2023-11-17 SURGERY — CYSTOSCOPY, WITH STENT INSERTION
Anesthesia: General | Site: Ureter | Laterality: Left

## 2023-11-17 MED ORDER — OXYCODONE HCL 5 MG PO TABS: 5.0000 mg | ORAL_TABLET | Freq: Once | ORAL | Status: DC | PRN

## 2023-11-17 MED ORDER — SODIUM ZIRCONIUM CYCLOSILICATE 10 G PO PACK
10.0000 g | PACK | Freq: Every day | ORAL | Status: DC
  Administered 2023-11-17: 10 g via ORAL
  Filled 2023-11-17 (×2): qty 1

## 2023-11-17 MED ORDER — ACETAMINOPHEN 10 MG/ML IV SOLN
INTRAVENOUS | Status: AC
Start: 2023-11-17 — End: 2023-11-17
  Filled 2023-11-17: qty 100

## 2023-11-17 MED ORDER — FENTANYL CITRATE (PF) 100 MCG/2ML IJ SOLN
INTRAMUSCULAR | Status: DC | PRN
  Administered 2023-11-17: 50 ug via INTRAVENOUS

## 2023-11-17 MED ORDER — CEFAZOLIN SODIUM-DEXTROSE 2-4 GM/100ML-% IV SOLN
INTRAVENOUS | Status: AC
  Filled 2023-11-17: qty 100

## 2023-11-17 MED ORDER — OXYCODONE HCL 5 MG PO TABS
5.0000 mg | ORAL_TABLET | Freq: Four times a day (QID) | ORAL | Status: DC | PRN
  Administered 2023-11-19: 5 mg via ORAL
  Filled 2023-11-17: qty 1

## 2023-11-17 MED ORDER — LACTATED RINGERS IV SOLN
INTRAVENOUS | Status: DC | PRN
Start: 2023-11-17 — End: 2023-11-17

## 2023-11-17 MED ORDER — LIDOCAINE HCL (PF) 2 % IJ SOLN
INTRAMUSCULAR | Status: AC
  Filled 2023-11-17: qty 5

## 2023-11-17 MED ORDER — SODIUM CHLORIDE 0.9 % IV SOLN
1.0000 g | INTRAVENOUS | Status: DC
  Administered 2023-11-17 – 2023-11-19 (×3): 1 g via INTRAVENOUS
  Filled 2023-11-17 (×3): qty 10

## 2023-11-17 MED ORDER — IOHEXOL 300 MG/ML  SOLN
INTRAMUSCULAR | Status: DC | PRN
  Administered 2023-11-17: 10 mL

## 2023-11-17 MED ORDER — SUGAMMADEX SODIUM 200 MG/2ML IV SOLN
INTRAVENOUS | Status: DC | PRN
  Administered 2023-11-17: 200 mg via INTRAVENOUS

## 2023-11-17 MED ORDER — ONDANSETRON HCL 4 MG/2ML IJ SOLN
INTRAMUSCULAR | Status: AC
  Filled 2023-11-17: qty 2

## 2023-11-17 MED ORDER — DEXAMETHASONE SODIUM PHOSPHATE 10 MG/ML IJ SOLN
INTRAMUSCULAR | Status: DC | PRN
  Administered 2023-11-17: 4 mg via INTRAVENOUS

## 2023-11-17 MED ORDER — FENTANYL CITRATE PF 50 MCG/ML IJ SOSY: 25.0000 ug | PREFILLED_SYRINGE | INTRAMUSCULAR | Status: DC | PRN

## 2023-11-17 MED ORDER — POLYETHYLENE GLYCOL 3350 17 G PO PACK: 17.0000 g | PACK | Freq: Every day | ORAL | Status: DC | PRN

## 2023-11-17 MED ORDER — CEFAZOLIN SODIUM-DEXTROSE 2-3 GM-%(50ML) IV SOLR
INTRAVENOUS | Status: DC | PRN
  Administered 2023-11-17: 2 g via INTRAVENOUS

## 2023-11-17 MED ORDER — ROCURONIUM BROMIDE 50 MG/5ML IV SOSY
PREFILLED_SYRINGE | INTRAVENOUS | Status: DC | PRN
  Administered 2023-11-17: 10 mg via INTRAVENOUS
  Administered 2023-11-17: 30 mg via INTRAVENOUS

## 2023-11-17 MED ORDER — WATER FOR IRRIGATION, STERILE IR SOLN
Status: DC | PRN
  Administered 2023-11-17: 3000 mL via INTRAVESICAL

## 2023-11-17 MED ORDER — PROCHLORPERAZINE EDISYLATE 10 MG/2ML IJ SOLN: 5.0000 mg | Freq: Four times a day (QID) | INTRAMUSCULAR | Status: DC | PRN

## 2023-11-17 MED ORDER — TAMSULOSIN HCL 0.4 MG PO CAPS
0.4000 mg | ORAL_CAPSULE | Freq: Every day | ORAL | Status: DC
  Administered 2023-11-17 – 2023-11-20 (×3): 0.4 mg via ORAL
  Filled 2023-11-17 (×3): qty 1

## 2023-11-17 MED ORDER — LIDOCAINE 2% (20 MG/ML) 5 ML SYRINGE
INTRAMUSCULAR | Status: DC | PRN
  Administered 2023-11-17: 100 mg via INTRAVENOUS

## 2023-11-17 MED ORDER — ACETAMINOPHEN 10 MG/ML IV SOLN
INTRAVENOUS | Status: DC | PRN
  Administered 2023-11-17: 1000 mg via INTRAVENOUS

## 2023-11-17 MED ORDER — SUCCINYLCHOLINE CHLORIDE 200 MG/10ML IV SOSY
PREFILLED_SYRINGE | INTRAVENOUS | Status: DC | PRN
  Administered 2023-11-17: 120 mg via INTRAVENOUS

## 2023-11-17 MED ORDER — ONDANSETRON HCL 4 MG/2ML IJ SOLN
INTRAMUSCULAR | Status: DC | PRN
  Administered 2023-11-17: 4 mg via INTRAVENOUS

## 2023-11-17 MED ORDER — DROPERIDOL 2.5 MG/ML IJ SOLN: 0.6250 mg | Freq: Once | INTRAMUSCULAR | Status: DC | PRN

## 2023-11-17 MED ORDER — PROPOFOL 10 MG/ML IV BOLUS
INTRAVENOUS | Status: DC | PRN
Start: 2023-11-17 — End: 2023-11-17
  Administered 2023-11-17: 180 mg via INTRAVENOUS

## 2023-11-17 MED ORDER — SODIUM CHLORIDE 0.9 % IV BOLUS
1000.0000 mL | Freq: Once | INTRAVENOUS | Status: AC
  Administered 2023-11-17: 1000 mL via INTRAVENOUS

## 2023-11-17 MED ORDER — SODIUM CHLORIDE 0.45 % IV SOLN: INTRAVENOUS | Status: AC

## 2023-11-17 MED ORDER — DEXAMETHASONE SODIUM PHOSPHATE 10 MG/ML IJ SOLN
INTRAMUSCULAR | Status: AC
  Filled 2023-11-17: qty 1

## 2023-11-17 MED ORDER — ACETAMINOPHEN 325 MG PO TABS: 650.0000 mg | ORAL_TABLET | Freq: Four times a day (QID) | ORAL | Status: DC | PRN

## 2023-11-17 MED ORDER — OXYCODONE HCL 5 MG/5ML PO SOLN: 5.0000 mg | Freq: Once | ORAL | Status: DC | PRN

## 2023-11-17 MED ORDER — MELATONIN 5 MG PO TABS: 5.0000 mg | ORAL_TABLET | Freq: Every evening | ORAL | Status: DC | PRN

## 2023-11-17 MED ORDER — PROPOFOL 10 MG/ML IV BOLUS
INTRAVENOUS | Status: AC
  Filled 2023-11-17: qty 20

## 2023-11-17 MED ORDER — FENTANYL CITRATE (PF) 100 MCG/2ML IJ SOLN
INTRAMUSCULAR | Status: AC
  Filled 2023-11-17: qty 2

## 2023-11-17 SURGICAL SUPPLY — 11 items
BAG URO CATCHER STRL LF (MISCELLANEOUS) ×1 IMPLANT
CATH URETL OPEN 5X70 (CATHETERS) ×1 IMPLANT
CLOTH BEACON ORANGE TIMEOUT ST (SAFETY) ×1 IMPLANT
GLOVE BIO SURGEON STRL SZ7 (GLOVE) ×1 IMPLANT
GOWN STRL REUS W/ TWL XL LVL3 (GOWN DISPOSABLE) ×1 IMPLANT
GUIDEWIRE ZIPWRE .038 STRAIGHT (WIRE) ×1 IMPLANT
KIT TURNOVER KIT A (KITS) IMPLANT
MANIFOLD NEPTUNE II (INSTRUMENTS) ×1 IMPLANT
PACK CYSTO (CUSTOM PROCEDURE TRAY) ×1 IMPLANT
STENT URET 6FRX26 CONTOUR (STENTS) IMPLANT
TUBING CONNECTING 10 (TUBING) ×1 IMPLANT

## 2023-11-17 NOTE — H&P (Signed)
 History and Physical  Dustin Todd WUJ:[id number] DOB: 09-18-1965 DOA: 11/17/2023  Referring physician: Accepted by Dr. Vann, [facility], hospitalist service. PCP: Pcp, No  Outpatient Specialists: ENT, neurology Patient coming from: Home to [facility] ED.  Chief Complaint: Abdominal pain and distention.  HPI: Dustin Todd is a 58 y.o. male with medical history significant for cluster headaches, recurrent epistaxis followed by ENT, nephrolithiasis, who presents to the ER with complaints of abdominal pain and distention, progressively worsening for the past 2 weeks.  Associated with diarrhea yesterday.  No reported nausea or vomiting.  No reported overt bleeding.  He presented to [facility] ER for further evaluation.  In the ER, lab work was notable for elevated creatinine 6.31, above baseline of 0.96.  CT abdomen and pelvis without contrast was obtained and revealed to distal left ureteral stones lodged just above the UVJ measuring 7 and 11 mm resulting in moderate left-sided hydronephrosis and dilatation of the ureter.  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  7 mm nonobstructing stone in the lower pole of the left kidney.  EDP discussed the case with urology.  The patient was taken to the OR for 2025.  Status post cystoscopy, left retrograde pyelogram with interpretation, left ureteral stent placement.  Urology recommended IR place nephrostomy tube on the right with ultimate plan for right PCNL.  The patient was admitted by [facility], hospitalist service, accepted by Dr. Carloyn Chi and transferred to [facility] telemetry unit as inpatient status.  At the time of this visit, the patient is alert and oriented x 3.  His pain is well-controlled on current pain management.  UA was positive for pyuria.  Urine culture ordered and IV Rocephin  initiated.  ED Course: Temperature 98.4.  BP 123/79, pulse 55, respiration rate 18, O2 saturation 97% on room air.  Lab  studies notable for hemoglobin 13.1, WBC 7.1, platelet count 156.  Serum sodium 129, BUN 47, creatinine 7.10.  Serum bicarb 21.  GFR 10.  Review of Systems: Review of systems as noted in the HPI. All other systems reviewed and are negative.   Past Medical History:  Diagnosis Date   Kidney stone    Past Surgical History:  Procedure Laterality Date   LITHOTRIPSY      Social History:  reports that he has never smoked. He has never used smokeless tobacco. He reports that he does not currently use alcohol. He reports that he does not use drugs.   Allergies  Allergen Reactions   Gluten Meal Other (See Comments)    Break outs, stomach upset   Penicillins Rash    Family History  Problem Relation Age of Onset   Healthy Mother    Heart disease Father        pacemaker   Cancer Maternal Grandmother        Breast   Cancer Paternal Grandfather        Bone      Prior to Admission medications   Medication Sig Start Date End Date Taking? Authorizing Provider  cetirizine  (ZYRTEC ) 10 MG tablet Take 1 tablet (10 mg total) by mouth daily. Patient taking differently: Take 10 mg by mouth daily as needed for allergies. 04/18/23  Yes Soldatova, [place], MD  azelastine  (ASTELIN ) 0.1 % nasal spray Place 2 sprays into both nostrils 2 (two) times daily. Use in each nostril as directed Patient not taking: Reported on 11/17/2023 04/11/23   Artice Last, MD    Physical Exam: BP Aaron Aas)  140/79 (BP Location: Left Arm)   Pulse (!) 57   Temp 98.1 F (36.7 C) (Oral)   Resp 16   Wt 83.5 kg   SpO2 100%   BMI 27.98 kg/m   General: 58 y.o. year-old male well developed well nourished in no acute distress.  Alert and oriented x3. Cardiovascular: Regular rate and rhythm with no rubs or gallops.  No thyromegaly or JVD noted.  No lower extremity edema. 2/4 pulses in all 4 extremities. Respiratory: Clear to auscultation with no wheezes or rales. Good inspiratory effort. Abdomen: Mildly distended with normal  bowel sounds x4 quadrants. Muskuloskeletal: No cyanosis, clubbing or edema noted bilaterally Neuro: CN II-XII intact, strength, sensation, reflexes Skin: No ulcerative lesions noted or rashes Psychiatry: Judgement and insight appear normal. Mood is appropriate for condition and setting          Labs on Admission:  Basic Metabolic Panel: Recent Labs  Lab 11/17/23 1225 11/17/23 1720  NA 127* 129*  K 5.4* 4.8  CL 95* 101  CO2 21*  --   GLUCOSE 92 88  BUN 54* 47*  CREATININE 6.31* 7.10*  CALCIUM 8.8*  --   PHOS 5.3*  --    Liver Function Tests: Recent Labs  Lab 11/17/23 1225  AST 11*  ALT 12  ALKPHOS 50  BILITOT 0.9  PROT 6.6  ALBUMIN 3.9   Recent Labs  Lab 11/17/23 1225  LIPASE 21   No results for input(s): "AMMONIA" in the last 168 hours. CBC: Recent Labs  Lab 11/17/23 1225 11/17/23 1720  WBC 7.1  --   NEUTROABS 5.5  --   HGB 13.1 11.9*  HCT 36.9* 35.0*  MCV 86.2  --   PLT 156  --    Cardiac Enzymes: No results for input(s): "CKTOTAL", "CKMB", "CKMBINDEX", "TROPONINI" in the last 168 hours.  BNP (last 3 results) No results for input(s): "BNP" in the last 8760 hours.  ProBNP (last 3 results) No results for input(s): "PROBNP" in the last 8760 hours.  CBG: No results for input(s): "GLUCAP" in the last 168 hours.  Radiological Exams on Admission: DG C-Arm 1-60 Min-No Report Result Date: 11/17/2023 Fluoroscopy was utilized by the requesting physician.  No radiographic interpretation.   CT ABDOMEN PELVIS WO CONTRAST Result Date: 11/17/2023 CLINICAL DATA:  Abdominal pain and distension for 2 weeks. Difficulty urinating for 2 days. EXAM: CT ABDOMEN AND PELVIS WITHOUT CONTRAST TECHNIQUE: Multidetector CT imaging of the abdomen and pelvis was performed following the standard protocol without IV contrast. RADIATION DOSE REDUCTION: This exam was performed according to the departmental dose-optimization program which includes automated exposure control,  adjustment of the mA and/or kV according to patient size and/or use of iterative reconstruction technique. COMPARISON:  Abdominal ultrasound 03/11/2009 FINDINGS: Lower chest: Mild dependent atelectasis is present at the lung bases. The lungs are otherwise clear. The heart size is normal. No significant pleural or pericardial effusion is present. Hepatobiliary: Previously described hepatic lesions are not evident on this noncontrast study. The liver is within normal limits. Common bile duct and gallbladder are normal. Pancreas: Unremarkable. No pancreatic ductal dilatation or surrounding inflammatory changes. Spleen: Normal in size without focal abnormality. The adrenal glands are normal bilaterally. Adrenals/Urinary Tract: A staghorn calculus is present in the right kidney with obstructing component extending to the right UPJ. The more distal ureter is within normal limits. Mild inflammatory changes surround the right kidney. A 7 mm nonobstructing stone is present the lower pole of the left kidney. Moderate left  hydronephrosis present. The ureter is dilated. Two distal left ureteral stones are lodged just above the UVJ measuring 7 and 11 mm respectively. The urinary bladder is mostly collapsed. Stomach/Bowel: The stomach and duodenum are within normal limits. The small bowel is unremarkable. Terminal ileum is normal. The appendix is visualized and normal. The ascending and transverse colon are within normal limits. The descending and sigmoid colon are normal. Vascular/Lymphatic: Minimal atherosclerotic calcifications are present in the aorta and branch vessels. No aneurysm is present. No significant adenopathy is present. Reproductive: Status post hysterectomy. No adnexal masses. Other: No abdominal wall hernia or abnormality. No abdominopelvic ascites. Musculoskeletal: The vertebral body heights alignment are normal. Vacuum disc is present at L5-S1. No focal osseous lesions are present. Bony pelvis is normal. The  hips are located and within normal limits. IMPRESSION: 1. Two distal left ureteral stones are lodged just above the UVJ measuring 7 and 11 mm result in moderate left-sided hydronephrosis and dilation of the ureter. 2. 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 3. 7 mm nonobstructing stone in the lower pole of the left kidney. 4. Previously described hepatic lesions are not evident on this noncontrast study. Electronically Signed   By: Audree Leas M.D.   On: 11/17/2023 14:57    EKG: I independently viewed the EKG done and my findings are as followed: Sinus bradycardia rate of 54.  Nonspecific ST changes.  QTC 503.  Assessment/Plan Present on Admission:  AKI (acute kidney injury) (HCC)  Principal Problem:   AKI (acute kidney injury) (HCC)  AKI, postobstructive from kidney stones Urology consulted and following IR also consulted per urology's recommendation Post left ureteral stent placement on 11/17/2023 as stated above Continue IV fluid hydration Continue to strictly avoid nephrotoxic agents, dehydration, and hypotension. Monitor urine output Continue Flomax  Repeat BMP in the morning  Complicated UTI in the setting of obstructive uropathy Follow urine culture for ID and sensitivity Rocephin  started De-escalate antibiotics as able  Hyperkalemia secondary to acute renal insufficiency Presented with serum potassium of 5.4 Resolved Was treated with Lokelma  Currently on Lokelma  10 g daily  Hyperphosphatemia in the setting of acute renal insufficiency Repeat phosphorus level    Time: 75 minutes.    DVT prophylaxis: SCDs.  Code Status: Full code.  Family Communication: Significant other at bedside.  Disposition Plan: Admitted to telemetry unit.  Consults called: Urology and IR.  Admission status: Inpatient status.   Status is: Inpatient The patient requires at least 2 midnights for further evaluation and  treatment of present condition.   Bary Boss MD [facility] Pager [phone]  If 7PM-7AM, please contact night-coverage [email or web address] Password [facility]  11/17/2023, 10:44 PM

## 2023-11-17 NOTE — Anesthesia Procedure Notes (Signed)
 Procedure Name: Intubation Date/Time: 11/17/2023 5:34 PM  Performed by: Melodee Spruce, CRNAPre-anesthesia Checklist: Patient identified, Emergency Drugs available, Suction available and Patient being monitored Patient Re-evaluated:Patient Re-evaluated prior to induction Oxygen Delivery Method: Circle system utilized Preoxygenation: Pre-oxygenation with 100% oxygen Induction Type: IV induction, Rapid sequence and Cricoid Pressure applied Laryngoscope Size: Miller and 3 Grade View: Grade I Tube type: Oral Tube size: 7.5 mm Number of attempts: 1 Airway Equipment and Method: Stylet Placement Confirmation: ETT inserted through vocal cords under direct vision, positive ETCO2 and breath sounds checked- equal and bilateral Secured at: 21 cm Tube secured with: Tape Dental Injury: Teeth and Oropharynx as per pre-operative assessment

## 2023-11-17 NOTE — Consult Note (Signed)
 Urology Consult    Reason for consult: left ureteral stones, right staghorn calculi  History of Present Illness: Dustin Todd is a 58 y.o. M who presented to [facility] this afternoon c/o abdominal pain and distension. Labs notable for Cr 6.31, UA LE, WBC positive.  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   Patient does have a history of stones requiring intervention before  At the time of my exam, patient endorses some left sided abdominal pain. Denies subjective fever symptoms  Past Medical History:  Diagnosis Date   Kidney stone     Past Surgical History:  Procedure Laterality Date   LITHOTRIPSY      Current Hospital Medications:  Home Meds:  No current facility-administered medications on file prior to encounter.   Current Outpatient Medications on File Prior to Encounter  Medication Sig Dispense Refill   azelastine  (ASTELIN ) 0.1 % nasal spray Place 2 sprays into both nostrils 2 (two) times daily. Use in each nostril as directed 30 mL 12   cephALEXin  (KEFLEX ) 500 MG capsule Take 1 capsule (500 mg total) by mouth 3 (three) times daily. 21 capsule 0   cetirizine  (ZYRTEC ) 10 MG tablet Take 1 tablet (10 mg total) by mouth daily. 30 tablet 11   ibuprofen (ADVIL) 200 MG tablet Take 200 mg by mouth every 6 (six) hours as needed for headache.     oxymetazoline  (AFRIN NASAL SPRAY) 0.05 % nasal spray Place 1 spray into both nostrils 2 (two) times daily. 30 mL 0   sodium chloride  (OCEAN) 0.65 % SOLN nasal spray Place 1 spray into both nostrils as needed for congestion. 30 mL 0     Scheduled Meds:  sodium zirconium cyclosilicate   10 g Oral Daily   tamsulosin   0.4 mg Oral Daily   Continuous Infusions: PRN Meds:.  Allergies:  Allergies  Allergen Reactions   Gluten Meal Other (See Comments)    Break outs, stomach upset   Penicillins  Rash    Family History  Problem Relation Age of Onset   Healthy Mother    Heart disease Father        pacemaker   Cancer Maternal Grandmother        Breast   Cancer Paternal Grandfather        Bone    Social History:  reports that he has never smoked. He has never used smokeless tobacco. He reports that he does not currently use alcohol. He reports that he does not use drugs.  ROS: A complete review of systems was performed.  All systems are negative except for pertinent findings as noted.  Physical Exam:  Vital signs in last 24 hours: Temp:  [98 F (36.7 C)] 98 F (36.7 C) (04/20 1137) Pulse Rate:  [52-68] 52 (04/20 1500) Resp:  [17-18] 18 (04/20 1500) BP: (166-182)/(87-96) 166/87 (04/20 1500) SpO2:  [97 %-100 %] 97 % (04/20 1500) Weight:  [83.5 kg] 83.5 kg (04/20 1140) Constitutional:  Alert and oriented, No acute distress Cardiovascular: Regular rate and rhythm Respiratory: Normal respiratory effort, Lungs clear bilaterally GI: Abdomen is soft, nontender, nondistended, no abdominal masses Neurologic: Grossly intact, no focal deficits Psychiatric: Normal mood and affect  Laboratory Data:  Recent Labs    11/17/23 1225  WBC 7.1  HGB 13.1  HCT 36.9*  PLT 156    Recent Labs    11/17/23 1225  NA 127*  K 5.4*  CL 95*  GLUCOSE 92  BUN 54*  CALCIUM 8.8*  CREATININE 6.31*     Results for orders placed or performed during the hospital encounter of 11/17/23 (from the past 24 hours)  CBC with Differential     Status: Abnormal   Collection Time: 11/17/23 12:25 PM  Result Value Ref Range   WBC 7.1 4.0 - 10.5 K/uL   RBC 4.28 4.22 - 5.81 MIL/uL   Hemoglobin 13.1 13.0 - 17.0 g/dL   HCT 82.9 (L) 56.2 - 13.0 %   MCV 86.2 80.0 - 100.0 fL   MCH 30.6 26.0 - 34.0 pg   MCHC 35.5 30.0 - 36.0 g/dL   RDW 86.5 (L) 78.4 - 69.6 %   Platelets 156 150 - 400 K/uL   nRBC 0.0 0.0 - 0.2 %   Neutrophils Relative % 77 %   Neutro Abs 5.5 1.7 - 7.7 K/uL   Lymphocytes Relative 13  %   Lymphs Abs 0.9 0.7 - 4.0 K/uL   Monocytes Relative 9 %   Monocytes Absolute 0.6 0.1 - 1.0 K/uL   Eosinophils Relative 1 %   Eosinophils Absolute 0.1 0.0 - 0.5 K/uL   Basophils Relative 0 %   Basophils Absolute 0.0 0.0 - 0.1 K/uL   Immature Granulocytes 0 %   Abs Immature Granulocytes 0.02 0.00 - 0.07 K/uL  Comprehensive metabolic panel     Status: Abnormal   Collection Time: 11/17/23 12:25 PM  Result Value Ref Range   Sodium 127 (L) 135 - 145 mmol/L   Potassium 5.4 (H) 3.5 - 5.1 mmol/L   Chloride 95 (L) 98 - 111 mmol/L   CO2 21 (L) 22 - 32 mmol/L   Glucose, Bld 92 70 - 99 mg/dL   BUN 54 (H) 6 - 20 mg/dL   Creatinine, Ser 2.95 (H) 0.61 - 1.24 mg/dL   Calcium 8.8 (L) 8.9 - 10.3 mg/dL   Total Protein 6.6 6.5 - 8.1 g/dL   Albumin 3.9 3.5 - 5.0 g/dL   AST 11 (L) 15 - 41 U/L   ALT 12 0 - 44 U/L   Alkaline Phosphatase 50 38 - 126 U/L   Total Bilirubin 0.9 0.0 - 1.2 mg/dL   GFR, Estimated 10 (L) >60 mL/min   Anion gap 11 5 - 15  Lipase, blood     Status: None   Collection Time: 11/17/23 12:25 PM  Result Value Ref Range   Lipase 21 11 - 51 U/L  Phosphorus     Status: Abnormal   Collection Time: 11/17/23 12:25 PM  Result Value Ref Range   Phosphorus 5.3 (H) 2.5 - 4.6 mg/dL  Urinalysis, Routine w reflex microscopic -Urine, Clean Catch     Status: Abnormal   Collection Time: 11/17/23  2:15 PM  Result Value Ref Range   Color, Urine COLORLESS (A) YELLOW   APPearance CLEAR CLEAR   Specific Gravity, Urine 1.005 1.005 - 1.030   pH 6.5 5.0 - 8.0   Glucose, UA NEGATIVE NEGATIVE mg/dL   Hgb urine dipstick MODERATE (A) NEGATIVE   Bilirubin Urine NEGATIVE NEGATIVE   Ketones, ur NEGATIVE NEGATIVE mg/dL   Protein, ur NEGATIVE NEGATIVE mg/dL   Nitrite NEGATIVE NEGATIVE   Leukocytes,Ua MODERATE (A) NEGATIVE   RBC / HPF 0-5 0 - 5 RBC/hpf   WBC, UA 11-20 0 - 5 WBC/hpf   Bacteria, UA NONE SEEN NONE SEEN   Squamous Epithelial / HPF 0-5 0 - 5 /HPF  No results found for this or any  previous visit (from the past 240 hours).  Renal Function: Recent Labs    11/17/23 1225  CREATININE 6.31*   CrCl cannot be calculated (Unknown ideal weight.).  Radiologic Imaging: CT ABDOMEN PELVIS WO CONTRAST Result Date: 11/17/2023 CLINICAL DATA:  Abdominal pain and distension for 2 weeks. Difficulty urinating for 2 days. EXAM: CT ABDOMEN AND PELVIS WITHOUT CONTRAST TECHNIQUE: Multidetector CT imaging of the abdomen and pelvis was performed following the standard protocol without IV contrast. RADIATION DOSE REDUCTION: This exam was performed according to the departmental dose-optimization program which includes automated exposure control, adjustment of the mA and/or kV according to patient size and/or use of iterative reconstruction technique. COMPARISON:  Abdominal ultrasound 03/11/2009 FINDINGS: Lower chest: Mild dependent atelectasis is present at the lung bases. The lungs are otherwise clear. The heart size is normal. No significant pleural or pericardial effusion is present. Hepatobiliary: Previously described hepatic lesions are not evident on this noncontrast study. The liver is within normal limits. Common bile duct and gallbladder are normal. Pancreas: Unremarkable. No pancreatic ductal dilatation or surrounding inflammatory changes. Spleen: Normal in size without focal abnormality. The adrenal glands are normal bilaterally. Adrenals/Urinary Tract: A staghorn calculus is present in the right kidney with obstructing component extending to the right UPJ. The more distal ureter is within normal limits. Mild inflammatory changes surround the right kidney. A 7 mm nonobstructing stone is present the lower pole of the left kidney. Moderate left hydronephrosis present. The ureter is dilated. Two distal left ureteral stones are lodged just above the UVJ measuring 7 and 11 mm respectively. The urinary bladder is mostly collapsed. Stomach/Bowel: The stomach and duodenum are within normal limits. The  small bowel is unremarkable. Terminal ileum is normal. The appendix is visualized and normal. The ascending and transverse colon are within normal limits. The descending and sigmoid colon are normal. Vascular/Lymphatic: Minimal atherosclerotic calcifications are present in the aorta and branch vessels. No aneurysm is present. No significant adenopathy is present. Reproductive: Status post hysterectomy. No adnexal masses. Other: No abdominal wall hernia or abnormality. No abdominopelvic ascites. Musculoskeletal: The vertebral body heights alignment are normal. Vacuum disc is present at L5-S1. No focal osseous lesions are present. Bony pelvis is normal. The hips are located and within normal limits. IMPRESSION: 1. Two distal left ureteral stones are lodged just above the UVJ measuring 7 and 11 mm result in moderate left-sided hydronephrosis and dilation of the ureter. 2. 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 3. 7 mm nonobstructing stone in the lower pole of the left kidney. 4. Previously described hepatic lesions are not evident on this noncontrast study. Electronically Signed   By: Audree Leas M.D.   On: 11/17/2023 14:57    I independently reviewed the above imaging studies.  Impression/Recommendation 58 yo M with obstructing left distal ureteral calculi, substantial stone burden in right kidney including staghorn calculi, AKI  --To OR urgently for left ureteral stent placement. Procedure and risks reviewed with patient and wife. Will then arrange for staged left ureteroscopy as outpatient --recommend IR consult for R neph tube given size of staghorn on this side. Patient will ultimately need R PCNL for this, which will be arranged for as outpatient  Julene Oaks MD 11/17/2023, 3:49 PM  [facility]  Pager: [phone]

## 2023-11-17 NOTE — Transfer of Care (Signed)
 Immediate Anesthesia Transfer of Care Note  Patient: Dustin Todd  Procedure(s) Performed: CYSTOSCOPY, WITH STENT INSERTION (Left: Ureter)  Patient Location: PACU  Anesthesia Type:General  Level of Consciousness: awake and patient cooperative  Airway & Oxygen Therapy: Patient Spontanous Breathing  Post-op Assessment: Report given to RN and Post -op Vital signs reviewed and stable  Post vital signs: Reviewed and stable  Last Vitals:  Vitals Value Taken Time  BP 126/82 11/17/23 1806  Temp 36.9 C 11/17/23 1806  Pulse 73 11/17/23 1807  Resp 18 11/17/23 1807  SpO2 98 % 11/17/23 1807  Vitals shown include unfiled device data.  Last Pain:  Vitals:   11/17/23 1138  TempSrc:   PainSc: 6          Complications: No notable events documented.

## 2023-11-17 NOTE — ED Notes (Signed)
 Dustin Todd with cl called for transport

## 2023-11-17 NOTE — ED Provider Notes (Addendum)
 [place] EMERGENCY DEPARTMENT AT [facility] Provider Note   CSN: [id number] Arrival date & time: 11/17/23  1126     History  Chief Complaint  Patient presents with   Abdominal Pain    Dustin Todd is a 58 y.o. male.  Patient is a 70 male presenting for abdominal distention.  Patient admits to diffuse generalized abdominal pain, diarrhea yesterday, and abdominal distention that is been worsening over the past 2 weeks.  He denies nausea or vomiting.  Denies any black or bloody stools.  States he is otherwise eating and drinking normally.  Staying hydrated.  The history is provided by the patient. No language interpreter was used.  Abdominal Pain Associated symptoms: diarrhea   Associated symptoms: no chest pain, no chills, no cough, no dysuria, no fever, no hematuria, no shortness of breath, no sore throat and no vomiting        Home Medications Prior to Admission medications   Medication Sig Start Date End Date Taking? Authorizing Provider  azelastine  (ASTELIN ) 0.1 % nasal spray Place 2 sprays into both nostrils 2 (two) times daily. Use in each nostril as directed 04/11/23   Todd, Li[place], MD  cephALEXin  (KEFLEX ) 500 MG capsule Take 1 capsule (500 mg total) by mouth 3 (three) times daily. 02/28/23   Todd, Dustin [place], MD  cetirizine  (ZYRTEC ) 10 MG tablet Take 1 tablet (10 mg total) by mouth daily. 04/18/23   Todd, Liuba, MD  ibuprofen (ADVIL) 200 MG tablet Take 200 mg by mouth every 6 (six) hours as needed for headache.    [provider]  oxymetazoline  (AFRIN NASAL SPRAY) 0.05 % nasal spray Place 1 spray into both nostrils 2 (two) times daily. 03/05/23   Todd, Liuba, MD  sodium chloride  (OCEAN) 0.65 % SOLN nasal spray Place 1 spray into both nostrils as needed for congestion. 03/05/23   Todd, Liuba, MD      Allergies    Gluten meal and Penicillins    Review of Systems   Review of Systems  Constitutional:  Negative for chills and fever.   HENT:  Negative for ear pain and sore throat.   Eyes:  Negative for pain and visual disturbance.  Respiratory:  Negative for cough and shortness of breath.   Cardiovascular:  Negative for chest pain and palpitations.  Gastrointestinal:  Positive for abdominal pain and diarrhea. Negative for vomiting.  Genitourinary:  Negative for dysuria and hematuria.  Musculoskeletal:  Negative for arthralgias and back pain.  Skin:  Negative for color change and rash.  Neurological:  Negative for seizures and syncope.  All other systems reviewed and are negative.   Physical Exam Updated Vital Signs BP (!) 166/87 (BP Location: Right Arm)   Pulse (!) 52   Temp 98 F (36.7 C) (Oral)   Resp 18   Wt 83.5 kg   SpO2 97%   BMI 27.98 kg/m  Physical Exam Vitals and nursing note reviewed.  Constitutional:      General: He is not in acute distress.    Appearance: He is well-developed.  HENT:     Head: Normocephalic and atraumatic.  Eyes:     Conjunctiva/sclera: Conjunctivae normal.  Cardiovascular:     Rate and Rhythm: Normal rate and regular rhythm.     Heart sounds: No murmur heard. Pulmonary:     Effort: Pulmonary effort is normal. No respiratory distress.     Breath sounds: Normal breath sounds.  Abdominal:     General: Abdomen is protuberant. There is  distension.     Palpations: Abdomen is soft.     Tenderness: There is no abdominal tenderness.  Musculoskeletal:        General: No swelling.     Cervical back: Neck supple.  Skin:    General: Skin is warm and dry.     Capillary Refill: Capillary refill takes less than 2 seconds.  Neurological:     Mental Status: He is alert.  Psychiatric:        Mood and Affect: Mood normal.     ED Results / Procedures / Treatments   Labs (all labs ordered are listed, but only abnormal results are displayed) Labs Reviewed  CBC WITH DIFFERENTIAL/PLATELET - Abnormal; Notable for the following components:      Result Value   HCT 36.9 (*)    RDW  11.2 (*)    All other components within normal limits  COMPREHENSIVE METABOLIC PANEL WITH GFR - Abnormal; Notable for the following components:   Sodium 127 (*)    Potassium 5.4 (*)    Chloride 95 (*)    CO2 21 (*)    BUN 54 (*)    Creatinine, Ser 6.31 (*)    Calcium 8.8 (*)    AST 11 (*)    GFR, Estimated 10 (*)    All other components within normal limits  PHOSPHORUS - Abnormal; Notable for the following components:   Phosphorus 5.3 (*)    All other components within normal limits  URINALYSIS, ROUTINE W REFLEX MICROSCOPIC - Abnormal; Notable for the following components:   Color, Urine COLORLESS (*)    Hgb urine dipstick MODERATE (*)    Leukocytes,Ua MODERATE (*)    All other components within normal limits  LIPASE, BLOOD  NA AND K (SODIUM & POTASSIUM), RAND UR    EKG None  Radiology CT ABDOMEN PELVIS WO CONTRAST Result Date: 11/17/2023 CLINICAL DATA:  Abdominal pain and distension for 2 weeks. Difficulty urinating for 2 days. EXAM: CT ABDOMEN AND PELVIS WITHOUT CONTRAST TECHNIQUE: Multidetector CT imaging of the abdomen and pelvis was performed following the standard protocol without IV contrast. RADIATION DOSE REDUCTION: This exam was performed according to the departmental dose-optimization program which includes automated exposure control, adjustment of the mA and/or kV according to patient size and/or use of iterative reconstruction technique. COMPARISON:  Abdominal ultrasound 03/11/2009 FINDINGS: Lower chest: Mild dependent atelectasis is present at the lung bases. The lungs are otherwise clear. The heart size is normal. No significant pleural or pericardial effusion is present. Hepatobiliary: Previously described hepatic lesions are not evident on this noncontrast study. The liver is within normal limits. Common bile duct and gallbladder are normal. Pancreas: Unremarkable. No pancreatic ductal dilatation or surrounding inflammatory changes. Spleen: Normal in size without focal  abnormality. The adrenal glands are normal bilaterally. Adrenals/Urinary Tract: A staghorn calculus is present in the right kidney with obstructing component extending to the right UPJ. The more distal ureter is within normal limits. Mild inflammatory changes surround the right kidney. A 7 mm nonobstructing stone is present the lower pole of the left kidney. Moderate left hydronephrosis present. The ureter is dilated. Two distal left ureteral stones are lodged just above the UVJ measuring 7 and 11 mm respectively. The urinary bladder is mostly collapsed. Stomach/Bowel: The stomach and duodenum are within normal limits. The small bowel is unremarkable. Terminal ileum is normal. The appendix is visualized and normal. The ascending and transverse colon are within normal limits. The descending and sigmoid colon are normal. Vascular/Lymphatic:  Minimal atherosclerotic calcifications are present in the aorta and branch vessels. No aneurysm is present. No significant adenopathy is present. Reproductive: Status post hysterectomy. No adnexal masses. Other: No abdominal wall hernia or abnormality. No abdominopelvic ascites. Musculoskeletal: The vertebral body heights alignment are normal. Vacuum disc is present at L5-S1. No focal osseous lesions are present. Bony pelvis is normal. The hips are located and within normal limits. IMPRESSION: 1. Two distal left ureteral stones are lodged just above the UVJ measuring 7 and 11 mm result in moderate left-sided hydronephrosis and dilation of the ureter. 2. 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 3. 7 mm nonobstructing stone in the lower pole of the left kidney. 4. Previously described hepatic lesions are not evident on this noncontrast study. Electronically Signed   By: Audree Leas M.D.   On: 11/17/2023 14:57    Procedures Procedures    Medications Ordered in ED Medications  tamsulosin  (FLOMAX ) capsule 0.4 mg (0.4  mg Oral Given 11/17/23 1510)  sodium zirconium cyclosilicate  (LOKELMA ) packet 10 g (has no administration in time range)  sodium chloride  0.9 % bolus 1,000 mL (1,000 mLs Intravenous New Bag/Given 11/17/23 1505)    ED Course/ Medical Decision Making/ A&P Clinical Course as of 11/17/23 1539  Sun Nov 17, 2023  1535 Potassium(!): 5.4 [AG]    Clinical Course User Index [AG] Quinn Bucco, DO                                 Medical Decision Making Amount and/or Complexity of Data Reviewed Labs: ordered. Decision-making details documented in ED Course. Radiology: ordered.  Risk Prescription drug management. Decision regarding hospitalization.   Patient is a 58 year old male presenting for diffuse generalized abdominal discomfort, abdominal distention, and a 2-day history of diarrhea.  On exam patient is alert oriented x 3, no acute distress, afebrile, hypertensive with otherwise stable vital signs.  Blood pressure 169/95.  He states he does not take antihypertensives and he usually does not follow with a PCP.  On exam patient has no fluid wave.  No jaundice.  No scleral icterus no skin rashes.  Laboratory studies concerning for acute kidney injury with a creatinine of 6.31 and a GFR of 10.  Patient's last lab results were 02/28/2023 with a normal creatinine function of 0.96 and a GFR greater than 60.  He states although he has been having diarrhea he has been hydrating normally occluding having several glasses of water  a day.  He has never had an AKI in his history.  He does have a prior history of kidney stones but has not been having any flank pain or hematuria.  Urine studies are pending.  CT abdomen without contrast pending.  No reasons to think that patient has a prerenal etiology of acute kidney injury.  He is staying hydrated, has a stable hemoglobin and no recent history of acute blood loss.  No signs of heart failure or sepsis at this time.  Urine lites pending.  No known structural  abnormalities of the kidneys.  No known history of diabetes.  CT pending to rule out post renal obstruction.  Hypokalemia of 5.4. will hold lasix.  Lokelma  ordered. Hyponatremia present. 1 L nacl ordered.  3:39 PM UA demonstrates moderate leukocytes, moderate hematuria, no bacteria, and only 11-20 white blood cells.  Negative nitrates.  3:39 PM CT abdomen pelvis demonstrates: 1. Two distal left  ureteral stones are lodged just above the UVJ measuring 7 and 11 mm result in moderate left-sided hydronephrosis and dilation of the ureter. 2. 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 3. 7 mm nonobstructing stone in the lower pole of the left kidney. 4. Previously described hepatic lesions are not evident on this noncontrast study.  IVF already running.  Patient continued to offer pain medications and declined at this time stating it is more the abdominal distention not bothering him and less pain.  Placed NPO.  I spoke with urologist Dr. [facility] who recommends transfer to [facility] with plans to go to the OR this evening for left-sided stenting.  I spoke with hospitalist team who agrees to accept patient.    Final Clinical Impression(s) / ED Diagnoses Final diagnoses:  Hyperkalemia  Abdominal distension  AKI (acute kidney injury) (HCC)  Secondary hypertension  Hematuria, unspecified type  Calculus of kidney with calculus of ureter    Rx / DC Orders ED Discharge Orders     None         Quinn Bucco, DO 11/17/23 1541

## 2023-11-17 NOTE — ED Triage Notes (Signed)
 Pt c/o lower abd pain with distention x 2 weeks. Diarrhea yesterday

## 2023-11-17 NOTE — Anesthesia Preprocedure Evaluation (Signed)
 Anesthesia Evaluation  Patient identified by MRN, date of birth, ID band Patient awake    Reviewed: Allergy & Precautions, NPO status , Patient's Chart, lab work & pertinent test results  History of Anesthesia Complications Negative for: history of anesthetic complications  Airway Mallampati: II  TM Distance: >3 FB Neck ROM: Full    Dental  (+) Missing,    Pulmonary neg pulmonary ROS   Pulmonary exam normal        Cardiovascular negative cardio ROS Normal cardiovascular exam     Neuro/Psych  Headaches    GI/Hepatic negative GI ROS, Neg liver ROS,,,  Endo/Other  negative endocrine ROS    Renal/GU ARFRenal disease (Cr 7.1, K 4.8 (s/p Lokelma ))  negative genitourinary   Musculoskeletal negative musculoskeletal ROS (+)    Abdominal   Peds  Hematology negative hematology ROS (+)   Anesthesia Other Findings Day of surgery medications reviewed with patient.  Reproductive/Obstetrics negative OB ROS                             Anesthesia Physical Anesthesia Plan  ASA: 2 and emergent  Anesthesia Plan: General   Post-op Pain Management: Ofirmev  IV (intra-op)*   Induction: Intravenous and Rapid sequence  PONV Risk Score and Plan: 2 and Treatment may vary due to age or medical condition, Ondansetron , Dexamethasone  and Midazolam   Airway Management Planned: Oral ETT  Additional Equipment: None  Intra-op Plan:   Post-operative Plan: Extubation in OR  Informed Consent: I have reviewed the patients History and Physical, chart, labs and discussed the procedure including the risks, benefits and alternatives for the proposed anesthesia with the patient or authorized representative who has indicated his/her understanding and acceptance.     Dental advisory given  Plan Discussed with: CRNA  Anesthesia Plan Comments:        Anesthesia Quick Evaluation

## 2023-11-17 NOTE — Op Note (Signed)
 Operative Note  Preoperative diagnosis:  1.  Left ureteral stone 2. Left hydronephrosis 3. Right staghorn 4. AKI  Postoperative diagnosis: same  Procedure(s): 1.  Cystoscopy 2. left retrograde pyelogram with interpretation 3. left ureteral stent placement 4. Fluoroscopy <1 hour with intraoperative interpretation  Surgeon: Julene Oaks, MD  Assistants:  None  Anesthesia:  General  Complications:  None  EBL:  minimal  Drains/Catheters: 1.  Left 6Fr x 26cm ureteral stent  Intraoperative findings:   Cystoscopy demonstrated no suspicious lesions, masses, stones or other pathology. Left Retrograde pyelogram demonstrated hydronephrosis. There were filling defects in the distal left ureter corresponding with the stones Successful left ureteral stent placement with curl in the renal pelvis and bladder respectively.  Indication:  Dustin Todd is a 58 y.o. male with the above diagnoses After reviewing the management options for treatment, he elected to proceed with the above surgical procedure(s). We have discussed the potential benefits and risks of the procedure, side effects of the proposed treatment, the likelihood of the patient achieving the goals of the procedure, and any potential problems that might occur during the procedure or recuperation. Informed consent has been obtained.  Description of procedure: The patient was taken to the operating room and general anesthesia was induced.  The patient was placed in the dorsal lithotomy position, prepped and draped in the usual sterile fashion, and preoperative antibiotics were administered. A preoperative time-out was performed.   Cystourethroscopy was performed.  The patient's urethra was examined and was normal. There was some trilobar prostatic hypertrophy. The bladder was then systematically examined in its entirety. There was no evidence for any bladder tumors, stones, or other mucosal pathology.    Attention then turned to  the left ureteral orifice. A 0.038 zip wire was passed through the left orifice and over the wire a 5 Fr open ended catheter was inserted and passed up to the level of the renal pelvis. Omnipaque  contrast was injected through the ureteral catheter and a retrograde pyelogram was performed with findings as dictated above. The wire was then replaced and the open ended catheter was removed.   A 6Fr x 26cm ureteral stent was advance over the wire. The stent was positioned appropriately under fluoroscopic and cystoscopic guidance.  The wire was then removed with an adequate stent curl noted in the renal pelvis as well as in the bladder.  The bladder was then emptied and the procedure ended.  The patient appeared to tolerate the procedure well and without complications.  The patient was able to be awakened and transferred to the recovery unit in satisfactory condition.   Plan:   To floor, will arrange for left URS as outpatient Recommend IR place nephrostomy tube on the right with ultimate plan for R PCNL  [name] MD [facility]  Pager: [phone]

## 2023-11-18 ENCOUNTER — Encounter (HOSPITAL_COMMUNITY): Payer: Self-pay | Admitting: Urology

## 2023-11-18 DIAGNOSIS — N179 Acute kidney failure, unspecified: Secondary | ICD-10-CM | POA: Diagnosis not present

## 2023-11-18 LAB — CBC
HCT: 38.6 % — ABNORMAL LOW (ref 39.0–52.0)
Hemoglobin: 13 g/dL (ref 13.0–17.0)
MCH: 30.6 pg (ref 26.0–34.0)
MCHC: 33.7 g/dL (ref 30.0–36.0)
MCV: 90.8 fL (ref 80.0–100.0)
Platelets: 165 10*3/uL (ref 150–400)
RBC: 4.25 MIL/uL (ref 4.22–5.81)
RDW: 11.4 % — ABNORMAL LOW (ref 11.5–15.5)
WBC: 6.9 10*3/uL (ref 4.0–10.5)
nRBC: 0 % (ref 0.0–0.2)

## 2023-11-18 LAB — BASIC METABOLIC PANEL WITH GFR
Anion gap: 10 (ref 5–15)
BUN: 62 mg/dL — ABNORMAL HIGH (ref 6–20)
CO2: 20 mmol/L — ABNORMAL LOW (ref 22–32)
Calcium: 8.2 mg/dL — ABNORMAL LOW (ref 8.9–10.3)
Chloride: 102 mmol/L (ref 98–111)
Creatinine, Ser: 5.98 mg/dL — ABNORMAL HIGH (ref 0.61–1.24)
GFR, Estimated: 10 mL/min — ABNORMAL LOW (ref >60)
Glucose, Bld: 116 mg/dL — ABNORMAL HIGH (ref 70–99)
Potassium: 5.2 mmol/L — ABNORMAL HIGH (ref 3.5–5.1)
Sodium: 132 mmol/L — ABNORMAL LOW (ref 135–145)

## 2023-11-18 LAB — HIV ANTIBODY (ROUTINE TESTING W REFLEX): HIV Screen 4th Generation wRfx: NONREACTIVE

## 2023-11-18 LAB — MAGNESIUM: Magnesium: 2.1 mg/dL (ref 1.7–2.4)

## 2023-11-18 LAB — PHOSPHORUS: Phosphorus: 6.7 mg/dL — ABNORMAL HIGH (ref 2.5–4.6)

## 2023-11-18 NOTE — Plan of Care (Signed)

## 2023-11-18 NOTE — Consult Note (Addendum)
 Chief Complaint: Patient was seen in consultation today for obstructive nephrolithiasis, with consideration for percutaneous nephrostomy tube placement.  Referring Provider(s): Dr. Reesa Cannon, Do   Supervising Physician: Art Largo  Patient Status: [facility] - In-pt  Patient is Full Code  History of Present Illness: Dustin Todd is a 58 y.o. male  with no PMHx on file who presented to ED on 4/20 complaining of abdominal pain.    ED CT AP w/o contrast: IMPRESSION: 1. Two distal left ureteral stones are lodged just above the UVJ measuring 7 and 11 mm result in moderate left-sided hydronephrosis and dilation of the ureter. 2. 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 3. 7 mm nonobstructing stone in the lower pole of the left kidney. 4. Previously described hepatic lesions are not evident on this noncontrast study.  Patient underwent emergency placement of left-sided ureteral stent by Urology, Dr. Jarvis Mesa.  Patient has agreed to plan for right-sided nephrostomy tube placement to facilitate outpatient management of right-sided staghorn.   Interventional Radiology was requested for right-sided nephrostomy tube placement. Request was reviewed and approved by Dr. Darylene Epley. Patient is tentatively scheduled for same in IR tomorrow.  All labs and medications are within acceptable parameters. No pertinent allergies. Patient will be NPO at midnight.    Patient is currently without any significant complaints. He maintains some mild abdominal discomfort, attributable to left-side stent in place. He is alert and laying in bed, calm. He denies any fevers, headache, chest pain, SOB, cough, nausea, vomiting or bleeding.     Past Medical History:  Diagnosis Date   Kidney stone     Past Surgical History:  Procedure Laterality Date   CYSTOSCOPY WITH STENT PLACEMENT Left 11/17/2023   Procedure: CYSTOSCOPY, WITH STENT INSERTION;  Surgeon:  Mallie Seal, MD;  Location: [facility];  Service: Urology;  Laterality: Left;   LITHOTRIPSY      Allergies: Gluten meal and Penicillins  Medications: Prior to Admission medications   Medication Sig Start Date End Date Taking? Authorizing Provider  cetirizine  (ZYRTEC ) 10 MG tablet Take 1 tablet (10 mg total) by mouth daily. Patient taking differently: Take 10 mg by mouth daily as needed for allergies. 04/18/23  Yes Soldatova, Liuba, MD  azelastine  (ASTELIN ) 0.1 % nasal spray Place 2 sprays into both nostrils 2 (two) times daily. Use in each nostril as directed Patient not taking: Reported on 11/17/2023 04/11/23   Artice Last, MD     Family History  Problem Relation Age of Onset   Healthy Mother    Heart disease Father        pacemaker   Cancer Maternal Grandmother        Breast   Cancer Paternal Grandfather        Bone    Social History   Socioeconomic History   Marital status: Married    Spouse name: Not on file   Number of children: 2   Years of education: college   Highest education level: Not on file  Occupational History   Occupation: self employed Architect  Tobacco Use   Smoking status: Never   Smokeless tobacco: Never  Vaping Use   Vaping status: Never Used  Substance and Sexual Activity   Alcohol use: Not Currently    Comment: occasional   Drug use: No   Sexual activity: Not on file  Other Topics Concern   Not on file  Social History Narrative   Married.  2 children (boy and girl).   Works at an [employer]   Enjoys going to [employer], golfing, basketball.   Right-handed.   One cup caffeine per day.   Social Drivers of Corporate investment banker Strain: Not on file  Food Insecurity: No Food Insecurity (11/17/2023)   Hunger Vital Sign    Worried About Running Out of Food in the Last Year: Never true    Ran Out of Food in the Last Year: Never true  Transportation Needs: Unknown (11/17/2023)   PRAPARE - Scientist, research (physical sciences) (Medical): Not on file    Lack of Transportation (Non-Medical): No  Physical Activity: Not on file  Stress: Not on file  Social Connections: Not on file     Review of Systems: A 12 point ROS discussed and pertinent positives are indicated in the HPI above.  All other systems are negative.  Vital Signs: BP 127/87 (BP Location: Left Arm)   Pulse (!) 54   Temp 98.2 F (36.8 C) (Oral)   Resp 20   Wt 184 lb (83.5 kg)   SpO2 98%   BMI 27.98 kg/m   Advance Care Plan: The advanced care place/surrogate decision maker was discussed at the time of visit and the patient did not wish to discuss or was not able to name a surrogate decision maker or provide an advance care plan.  Physical Exam Vitals reviewed.  Constitutional:      General: He is not in acute distress.    Appearance: Normal appearance.  HENT:     Mouth/Throat:     Mouth: Mucous membranes are moist.  Cardiovascular:     Rate and Rhythm: Normal rate and regular rhythm.     Pulses: Normal pulses.     Heart sounds: No murmur heard. Pulmonary:     Effort: Pulmonary effort is normal. No respiratory distress.     Breath sounds: Normal breath sounds.  Abdominal:     General: Abdomen is flat.     Palpations: Abdomen is soft.     Tenderness: There is abdominal tenderness in the left upper quadrant and left lower quadrant.  Musculoskeletal:        General: Normal range of motion.  Skin:    General: Skin is warm and dry.  Neurological:     Mental Status: He is alert and oriented to person, place, and time.  Psychiatric:        Mood and Affect: Mood normal.        Behavior: Behavior normal.        Thought Content: Thought content normal.        Judgment: Judgment normal.     Imaging: DG C-Arm 1-60 Min-No Report Result Date: 11/17/2023 Fluoroscopy was utilized by the requesting physician.  No radiographic interpretation.   CT ABDOMEN PELVIS WO CONTRAST Result Date: 11/17/2023 CLINICAL DATA:  Abdominal  pain and distension for 2 weeks. Difficulty urinating for 2 days. EXAM: CT ABDOMEN AND PELVIS WITHOUT CONTRAST TECHNIQUE: Multidetector CT imaging of the abdomen and pelvis was performed following the standard protocol without IV contrast. RADIATION DOSE REDUCTION: This exam was performed according to the departmental dose-optimization program which includes automated exposure control, adjustment of the mA and/or kV according to patient size and/or use of iterative reconstruction technique. COMPARISON:  Abdominal ultrasound 03/11/2009 FINDINGS: Lower chest: Mild dependent atelectasis is present at the lung bases. The lungs are otherwise clear. The heart size is normal. No significant pleural or  pericardial effusion is present. Hepatobiliary: Previously described hepatic lesions are not evident on this noncontrast study. The liver is within normal limits. Common bile duct and gallbladder are normal. Pancreas: Unremarkable. No pancreatic ductal dilatation or surrounding inflammatory changes. Spleen: Normal in size without focal abnormality. The adrenal glands are normal bilaterally. Adrenals/Urinary Tract: A staghorn calculus is present in the right kidney with obstructing component extending to the right UPJ. The more distal ureter is within normal limits. Mild inflammatory changes surround the right kidney. A 7 mm nonobstructing stone is present the lower pole of the left kidney. Moderate left hydronephrosis present. The ureter is dilated. Two distal left ureteral stones are lodged just above the UVJ measuring 7 and 11 mm respectively. The urinary bladder is mostly collapsed. Stomach/Bowel: The stomach and duodenum are within normal limits. The small bowel is unremarkable. Terminal ileum is normal. The appendix is visualized and normal. The ascending and transverse colon are within normal limits. The descending and sigmoid colon are normal. Vascular/Lymphatic: Minimal atherosclerotic calcifications are present in  the aorta and branch vessels. No aneurysm is present. No significant adenopathy is present. Reproductive: Status post hysterectomy. No adnexal masses. Other: No abdominal wall hernia or abnormality. No abdominopelvic ascites. Musculoskeletal: The vertebral body heights alignment are normal. Vacuum disc is present at L5-S1. No focal osseous lesions are present. Bony pelvis is normal. The hips are located and within normal limits. IMPRESSION: 1. Two distal left ureteral stones are lodged just above the UVJ measuring 7 and 11 mm result in moderate left-sided hydronephrosis and dilation of the ureter. 2. 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 3. 7 mm nonobstructing stone in the lower pole of the left kidney. 4. Previously described hepatic lesions are not evident on this noncontrast study. Electronically Signed   By: Audree Leas M.D.   On: 11/17/2023 14:57    Labs:  CBC: Recent Labs    02/28/23 0037 11/17/23 1225 11/17/23 1720 11/18/23 0556  WBC 5.7 7.1  --  6.9  HGB 15.2 13.1 11.9* 13.0  HCT 44.2 36.9* 35.0* 38.6*  PLT 178 156  --  165    COAGS: Recent Labs    02/28/23 0037  INR 0.9    BMP: Recent Labs    02/28/23 0037 11/17/23 1225 11/17/23 1720 11/18/23 0556  NA 136 127* 129* 132*  K 3.8 5.4* 4.8 5.2*  CL 101 95* 101 102  CO2 20* 21*  --  20*  GLUCOSE 111* 92 88 116*  BUN 14 54* 47* 62*  CALCIUM 9.3 8.8*  --  8.2*  CREATININE 0.96 6.31* 7.10* 5.98*  GFRNONAA >60 10*  --  10*    LIVER FUNCTION TESTS: Recent Labs    02/28/23 0037 11/17/23 1225  BILITOT 0.9 0.9  AST 22 11*  ALT 21 12  ALKPHOS 43 50  PROT 6.8 6.6  ALBUMIN 4.1 3.9    TUMOR MARKERS: No results for input(s): "AFPTM", "CEA", "CA199", "CHROMGRNA" in the last 8760 hours.  Assessment and Plan: Dustin Todd is a 59 y.o. male  with no PMHx on file who presented to ED on 4/20 complaining of abdominal pain.    ED CT AP w/o  contrast: IMPRESSION: 1. Two distal left ureteral stones are lodged just above the UVJ measuring 7 and 11 mm result in moderate left-sided hydronephrosis and dilation of the ureter. 2. 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 3.  7 mm nonobstructing stone in the lower pole of the left kidney. 4. Previously described hepatic lesions are not evident on this noncontrast study.  Patient underwent emergency placement of left-sided ureteral stent by Urology, Dr. Jarvis Mesa.  Patient has agreed to plan for right-sided nephrostomy tube placement to facilitate outpatient management of right-sided staghorn.   Patient will present for scheduled percutaneous nephrostomy tube placement in IR tomorrow.  Risks and benefits of right PCN placement was discussed with the patient including, but not limited to, infection, bleeding, significant bleeding causing loss or decrease in renal function or damage to adjacent structures.   All of the patient's questions were answered, patient is agreeable to proceed.  Consent signed and in chart.     Thank you for allowing our service to participate in Seydina R Kliethermes 's care.  Electronically Signed: Lovena Rubinstein, PA-C   11/18/2023, 8:54 PM      I spent a total of 40 Minutes in face to face in clinical consultation, greater than 50% of which was counseling/coordinating care for obstructive nephrolithiasis, with consideration for percutaneous nephrostomy tube placement.

## 2023-11-18 NOTE — Hospital Course (Addendum)
 58 y.o. male with  hx of cluster headaches, recurrent epistaxis followed by ENT, nephrolithiasis, who presented to the ER with complaints of abdominal pain and distention, progressively worsening for the past 2 weeks, with diarrhea but no nausea or vomiting or overt bleeding.  In the ED: had elevated creatinine 6.31, above baseline of 0.96. 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  Urology was consulted and underwent OR w/ cystoscopy left ureteral stent placement.  Urology recommended IR place nephrostomy tube-done 4/22, following which creatinine nicely improving plan is for outpatient definitive stone management.Urology has cleared the patient for discharge Discussed with Dr. Gwenevere Lent and he is agreeable for discharge home with 2 more days of oral antibiotics will arrange outpatient follow-up  Subjective: Seen and examined this morning Resting comfortably no new complaints voiding well Urine pinkish in color and nephrostomy tube Patient feels comfortable going home today  Discharge diagnosis: AKI-obstructive uropathy Left hydronephrosis Left ureteral stone Right staghorn calculus: Urology following- s/p cystoscopy and left ureteral stent, plan for IR right nephrostomy tube 4/22-done o n4/22 and eventually per Uro will plan right PCNL. Now S/P on improving, creat at 3.2-voiding well advised to avoid NSAIDs pain is controlled.Urology cleared for discharge.  If okay with IR will plan for discharge with instruction for drain management.  Encourage to keep appointment Loss Recent Labs    02/28/23 0037 11/17/23 1225 11/17/23 1720 11/18/23 0556 11/19/23 0612 11/20/23 0613  BUN 14 54* 47* 62* 59* 43*   CREATININE 0.96 6.31* 7.10* 5.98* 5.01* 3.24*  CO2 20* 21*  --  20* 19* 24  K 3.8 5.4* 4.8 5.2* 4.0 4.1    Metabolic acidosis due to AKI: Resolved   Complicated UTI in the setting of obstructive uropathy: S/p ceftriaxone . Changed to po on d/c  Hyperkalemia: Resolved.    Hyperphosphatemia: Monitor electrolytes.  Follow-up outpatient  Hyponatremia: Likely in the setting of AKI.  Resolved

## 2023-11-18 NOTE — Progress Notes (Signed)
 PROGRESS NOTE Dustin Todd  ZOX:[id number] DOB: [birth year] DOA: 11/17/2023 PCP: Pcp, No  Brief Narrative/Hospital Course: 58 y.o. male with  hx of cluster headaches, recurrent epistaxis followed by ENT, nephrolithiasis, who presented to the ER with complaints of abdominal pain and distention, progressively worsening for the past 2 weeks, with diarrhea but no nausea or vomiting or overt bleeding.  In the ED: had elevated creatinine 6.31, above baseline of 0.96. 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  Urology was consulted and underwent OR w/ cystoscopy left ureteral stent placement.  Urology recommended IR place nephrostomy tube on the right with ultimate plan for right PCNL and was admitted.  Subjective: Seen and examined Patient reports she has been voiding well Denies nausea vomiting fever chills Overnight afebrile heart rate in 50s on room air BP stable Labs reviewed mild hypokalemia 132 hyperkalemia 5.2 bicarb 20 creatinine down to 5.9 from 7.1 bun up 47>62, stable cbc   Assessment/Plan:  AKI-obstructive apathy Left hydronephrosis Left ureteral stone Right staghorn calculus: Urology on board status post cystoscopy and left ureteral stent and IR consulted for right nephrostomy tube and eventually will need right PCNL IR aware about the consult discussed this morning-planning for procedure tomorrow okay to feed-added low potassium diet. BUN/creatinine was elevated, slightly downtrending.  Continue IV fluids, continue ceftriaxone , Flomax  Monitor urine output, avoid nephrotoxic medication Discussed nursing staff for strict I and O Recent Labs    02/28/23 0037 11/17/23 1225 11/17/23 1720  11/18/23 0556  BUN 14 54* 47* 62*  CREATININE 0.96 6.31* 7.10* 5.98*  CO2 20* 21*  --  20*  K 3.8 5.4* 4.8 5.2*    Metabolic acidosis due to AKI: Monitor labs  Complicated UTI in the setting of obstructive uropathy: Continue ceftriaxone .  Urine culture ordered but unable to be sent yet will likely be low yield now  Hyperkalemia: Continue Lokelma  and monitor.  Continue low potassium diet   Hyperphosphatemia: Monitor electrolytes  Hyponatremia: Likely in the setting of AKI.  Monitor   DVT prophylaxis: SCDs Start: 11/17/23 2241 Code Status:   Code Status: Full Code Family Communication: plan of care discussed with patient at bedside. Patient status is: Remains hospitalized because of severity of illness Level of care: Telemetry   Dispo: The patient is from: home            Anticipated disposition: TBD  Objective: Vitals last 24 hrs: Vitals:   11/17/23 2334 11/18/23 0616 11/18/23 0746 11/18/23 1351  BP: 123/79 139/84 128/83 127/87  Pulse: (!) 55 (!) 55 (!) 54 (!) 54  Resp: 18 18 20 20   Temp: 98.4 F (36.9 C) 97.8 F (36.6 C) 98.4 F (36.9 C) 98.2 F (36.8 C)  TempSrc: Oral Oral Oral Oral  SpO2: 97% 99% 98% 98%  Weight:       Weight change:   Physical Examination: General exam: alert awake, older than stated age HEENT:Oral mucosa moist, Ear/Nose WNL grossly Respiratory system: Bilaterally diminished BS, no use of accessory muscle Cardiovascular system: S1 & S2 +. Gastrointestinal system: Abdomen soft, NT,ND,BS+ Nervous System: Alert, awake,following commands. Extremities: LE edema neg, moving arms legs non focal, warm legs Skin: No rashes,warm. MSK: Normal muscle bulk/tone.   Medications reviewed:  Scheduled Meds:  sodium zirconium cyclosilicate   10 g Oral Daily   tamsulosin   0.4 mg Oral Daily   Continuous Infusions:  sodium chloride  125 mL/hr at 11/18/23 7829   cefTRIAXone  (ROCEPHIN )  IV Stopped (11/18/23 0012)      Diet Order             Diet  renal with fluid restriction Fluid restriction: 2000 mL Fluid; Room service appropriate? Yes; Fluid consistency: Thin  Diet effective now                  Intake/Output Summary (Last 24 hours) at 11/18/2023 1422 Last data filed at 11/18/2023 0303 Gross per 24 hour  Intake 603.02 ml  Output --  Net 603.02 ml   Net IO Since Admission: 603.02 mL [11/18/23 1422]  Wt Readings from Last 3 Encounters:  11/17/23 83.5 kg  04/11/23 83.5 kg  03/05/23 79.8 kg    Unresulted Labs (From admission, onward)     Start     Ordered   11/19/23 0500  Basic metabolic panel with GFR  Daily,   R      11/18/23 0838   11/19/23 0500  CBC  Daily,   R      11/18/23 0838   11/17/23 2247  Urine Culture (for pregnant, neutropenic or urologic patients or patients with an indwelling urinary catheter)  (Urine Labs)  ONCE - URGENT,   URGENT       Question:  Indication  Answer:  Bacteriuria screening (OB/GYN or Uro)   11/17/23 2247          Data Reviewed: I have personally reviewed following labs and imaging studies ( see epic result tab) CBC: Recent Labs  Lab 11/17/23 1225 11/17/23 1720 11/18/23 0556  WBC 7.1  --  6.9  NEUTROABS 5.5  --   --   HGB 13.1 11.9* 13.0  HCT 36.9* 35.0* 38.6*  MCV 86.2  --  90.8  PLT 156  --  165   CMP: Recent Labs  Lab 11/17/23 1225 11/17/23 1720 11/18/23 0556  NA 127* 129* 132*  K 5.4* 4.8 5.2*  CL 95* 101 102  CO2 21*  --  20*  GLUCOSE 92 88 116*  BUN 54* 47* 62*  CREATININE 6.31* 7.10* 5.98*  CALCIUM 8.8*  --  8.2*  MG  --   --  2.1  PHOS 5.3*  --  6.7*   GFR: CrCl cannot be calculated (Unknown ideal weight.). Recent Labs  Lab 11/17/23 1225  AST 11*  ALT 12  ALKPHOS 50  BILITOT 0.9  PROT 6.6  ALBUMIN 3.9   Recent Labs  Lab 11/17/23 1225  LIPASE 21   No results for input(s): "AMMONIA" in the last 168 hours. No results found for this or any previous visit (from the past 240 hours).  Antimicrobials/Microbiology: Anti-infectives (From admission,  onward)    Start     Dose/Rate Route Frequency Ordered Stop   11/17/23 2345  cefTRIAXone  (ROCEPHIN ) 1 g in sodium chloride  0.9 % 100 mL IVPB        1 g 200 mL/hr over 30 Minutes Intravenous Every 24 hours 11/17/23 2247     11/17/23 1726  ceFAZolin  (ANCEF ) 2-4 GM/100ML-% IVPB       Note to Pharmacy: Suzette Espy A: cabinet override      11/17/23 1726 11/17/23 2237      No results found for: "SDES", "SPECREQUEST", "CULT", "REPTSTATUS"  Radiology Studies: DG C-Arm 1-60 Min-No Report Result Date: 11/17/2023 Fluoroscopy was  utilized by the requesting physician.  No radiographic interpretation.   CT ABDOMEN PELVIS WO CONTRAST Result Date: 11/17/2023 CLINICAL DATA:  Abdominal pain and distension for 2 weeks. Difficulty urinating for 2 days. EXAM: CT ABDOMEN AND PELVIS WITHOUT CONTRAST TECHNIQUE: Multidetector CT imaging of the abdomen and pelvis was performed following the standard protocol without IV contrast. RADIATION DOSE REDUCTION: This exam was performed according to the departmental dose-optimization program which includes automated exposure control, adjustment of the mA and/or kV according to patient size and/or use of iterative reconstruction technique. COMPARISON:  Abdominal ultrasound 03/11/2009 FINDINGS: Lower chest: Mild dependent atelectasis is present at the lung bases. The lungs are otherwise clear. The heart size is normal. No significant pleural or pericardial effusion is present. Hepatobiliary: Previously described hepatic lesions are not evident on this noncontrast study. The liver is within normal limits. Common bile duct and gallbladder are normal. Pancreas: Unremarkable. No pancreatic ductal dilatation or surrounding inflammatory changes. Spleen: Normal in size without focal abnormality. The adrenal glands are normal bilaterally. Adrenals/Urinary Tract: A staghorn calculus is present in the right kidney with obstructing component extending to the right UPJ. The more distal  ureter is within normal limits. Mild inflammatory changes surround the right kidney. A 7 mm nonobstructing stone is present the lower pole of the left kidney. Moderate left hydronephrosis present. The ureter is dilated. Two distal left ureteral stones are lodged just above the UVJ measuring 7 and 11 mm respectively. The urinary bladder is mostly collapsed. Stomach/Bowel: The stomach and duodenum are within normal limits. The small bowel is unremarkable. Terminal ileum is normal. The appendix is visualized and normal. The ascending and transverse colon are within normal limits. The descending and sigmoid colon are normal. Vascular/Lymphatic: Minimal atherosclerotic calcifications are present in the aorta and branch vessels. No aneurysm is present. No significant adenopathy is present. Reproductive: Status post hysterectomy. No adnexal masses. Other: No abdominal wall hernia or abnormality. No abdominopelvic ascites. Musculoskeletal: The vertebral body heights alignment are normal. Vacuum disc is present at L5-S1. No focal osseous lesions are present. Bony pelvis is normal. The hips are located and within normal limits. IMPRESSION: 1. Two distal left ureteral stones are lodged just above the UVJ measuring 7 and 11 mm result in moderate left-sided hydronephrosis and dilation of the ureter. 2. 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 3. 7 mm nonobstructing stone in the lower pole of the left kidney. 4. Previously described hepatic lesions are not evident on this noncontrast study. Electronically Signed   By: Audree Leas M.D.   On: 11/17/2023 14:57    LOS: 1 day    Total time spent in review of labs and imaging, patient evaluation, formulation of plan, documentation and communication with patient/family: 50 minutes  Lesa Rape, MD [facility] 11/18/2023, 2:22 PM

## 2023-11-18 NOTE — Anesthesia Postprocedure Evaluation (Signed)
 Anesthesia Post Note  Patient: Dustin Todd  Procedure(s) Performed: CYSTOSCOPY, WITH STENT INSERTION (Left: Ureter)     Patient location during evaluation: PACU Anesthesia Type: General Level of consciousness: awake and alert Pain management: pain level controlled Vital Signs Assessment: post-procedure vital signs reviewed and stable Respiratory status: spontaneous breathing, nonlabored ventilation and respiratory function stable Cardiovascular status: blood pressure returned to baseline Postop Assessment: no apparent nausea or vomiting Anesthetic complications: no   No notable events documented.  Last Vitals:  Vitals:   11/18/23 0746 11/18/23 1351  BP: 128/83 127/87  Pulse: (!) 54 (!) 54  Resp: 20 20  Temp: 36.9 C 36.8 C  SpO2: 98% 98%    Last Pain:  Vitals:   11/18/23 1351  TempSrc: Oral  PainSc:                  Rayfield [place]

## 2023-11-19 ENCOUNTER — Inpatient Hospital Stay (HOSPITAL_COMMUNITY)

## 2023-11-19 DIAGNOSIS — N179 Acute kidney failure, unspecified: Secondary | ICD-10-CM | POA: Diagnosis not present

## 2023-11-19 HISTORY — PX: IR NEPHROSTOMY PLACEMENT RIGHT: IMG6064

## 2023-11-19 LAB — CBC
HCT: 38.2 % — ABNORMAL LOW (ref 39.0–52.0)
Hemoglobin: 12.7 g/dL — ABNORMAL LOW (ref 13.0–17.0)
MCH: 30.4 pg (ref 26.0–34.0)
MCHC: 33.2 g/dL (ref 30.0–36.0)
MCV: 91.4 fL (ref 80.0–100.0)
Platelets: 173 10*3/uL (ref 150–400)
RBC: 4.18 MIL/uL — ABNORMAL LOW (ref 4.22–5.81)
RDW: 11.9 % (ref 11.5–15.5)
WBC: 7.7 10*3/uL (ref 4.0–10.5)
nRBC: 0 % (ref 0.0–0.2)

## 2023-11-19 LAB — URINE CULTURE: Culture: NO GROWTH

## 2023-11-19 LAB — BASIC METABOLIC PANEL WITH GFR
Anion gap: 12 (ref 5–15)
BUN: 59 mg/dL — ABNORMAL HIGH (ref 6–20)
CO2: 19 mmol/L — ABNORMAL LOW (ref 22–32)
Calcium: 8.1 mg/dL — ABNORMAL LOW (ref 8.9–10.3)
Chloride: 102 mmol/L (ref 98–111)
Creatinine, Ser: 5.01 mg/dL — ABNORMAL HIGH (ref 0.61–1.24)
GFR, Estimated: 13 mL/min — ABNORMAL LOW (ref >60)
Glucose, Bld: 101 mg/dL — ABNORMAL HIGH (ref 70–99)
Potassium: 4 mmol/L (ref 3.5–5.1)
Sodium: 133 mmol/L — ABNORMAL LOW (ref 135–145)

## 2023-11-19 LAB — PROTIME-INR
INR: 1 (ref 0.8–1.2)
Prothrombin Time: 13.8 s (ref 11.4–15.2)

## 2023-11-19 MED ORDER — LIDOCAINE-EPINEPHRINE 1 %-1:100000 IJ SOLN
INTRAMUSCULAR | Status: AC
  Filled 2023-11-19: qty 1

## 2023-11-19 MED ORDER — IOHEXOL 300 MG/ML  SOLN
50.0000 mL | Freq: Once | INTRAMUSCULAR | Status: AC | PRN
  Administered 2023-11-19: 20 mL

## 2023-11-19 MED ORDER — FENTANYL CITRATE (PF) 100 MCG/2ML IJ SOLN
INTRAMUSCULAR | Status: AC | PRN
Start: 2023-11-19 — End: 2023-11-19
  Administered 2023-11-19 (×2): 50 ug via INTRAVENOUS

## 2023-11-19 MED ORDER — FENTANYL CITRATE (PF) 100 MCG/2ML IJ SOLN
INTRAMUSCULAR | Status: AC
  Filled 2023-11-19: qty 4

## 2023-11-19 MED ORDER — SODIUM CHLORIDE 0.9 % IV SOLN
INTRAVENOUS | Status: AC
  Filled 2023-11-19: qty 20

## 2023-11-19 MED ORDER — LACTATED RINGERS IV SOLN: INTRAVENOUS | Status: AC

## 2023-11-19 MED ORDER — MIDAZOLAM HCL 2 MG/2ML IJ SOLN
INTRAMUSCULAR | Status: AC
Start: 2023-11-19 — End: ?
  Filled 2023-11-19: qty 6

## 2023-11-19 MED ORDER — SODIUM CHLORIDE 0.9 % IV SOLN
2.0000 g | Freq: Once | INTRAVENOUS | Status: AC
  Administered 2023-11-19: 2 g via INTRAVENOUS

## 2023-11-19 MED ORDER — DIPHENHYDRAMINE HCL 50 MG/ML IJ SOLN
INTRAMUSCULAR | Status: AC
  Filled 2023-11-19: qty 1

## 2023-11-19 MED ORDER — MIDAZOLAM HCL 2 MG/2ML IJ SOLN
INTRAMUSCULAR | Status: AC | PRN
  Administered 2023-11-19 (×3): 1 mg via INTRAVENOUS

## 2023-11-19 MED ORDER — LIDOCAINE-EPINEPHRINE 1 %-1:100000 IJ SOLN
20.0000 mL | Freq: Once | INTRAMUSCULAR | Status: AC
  Administered 2023-11-19: 10 mL via INTRADERMAL

## 2023-11-19 MED ORDER — LIDOCAINE-EPINEPHRINE (PF) 2 %-1:200000 IJ SOLN
20.0000 mL | Freq: Once | INTRAMUSCULAR | Status: DC
  Filled 2023-11-19: qty 20

## 2023-11-19 NOTE — Progress Notes (Signed)
 PROGRESS NOTE Dustin Todd  XBJ:[id number] DOB: [birth year] DOA: 11/17/2023 PCP: Pcp, No  Brief Narrative/Hospital Course: 58 y.o. male with  hx of cluster headaches, recurrent epistaxis followed by ENT, nephrolithiasis, who presented to the ER with complaints of abdominal pain and distention, progressively worsening for the past 2 weeks, with diarrhea but no nausea or vomiting or overt bleeding.  In the ED: had elevated creatinine 6.31, above baseline of 0.96. 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  Urology was consulted and underwent OR w/ cystoscopy left ureteral stent placement.  Urology recommended IR place nephrostomy tube on the right with ultimate plan for right PCNL and was admitted.  Subjective: Seen and examined this morning Overnight afebrile BP stable given normal BUN/creatinine downtrending  Having UOP 2695 cc and void well.  No complaints nausea vomiting abdominal pain N.p.o. for right nephrostomy tube today   Assessment/Plan:  AKI-obstructive apathy Left hydronephrosis Left ureteral stone Right staghorn calculus: Urology following- s/p cystoscopy and left ureteral stent, plan for IR right nephrostomy tube 4/22 and eventually per Uro will need right PCNL Patient having urine output, BUN/creatinine downtrending will support with IV fluid hydration. Continue monitor urine output, trend labs avoid nephrotoxic medication Recent Labs    02/28/23 0037 11/17/23 1225 11/17/23 1720 11/18/23 0556 11/19/23 0612  BUN 14 54* 47* 62* 59*  CREATININE 0.96 6.31* 7.10* 5.98* 5.01*  CO2 20* 21*  --  20* 19*  K 3.8 5.4* 4.8 5.2* 4.0    Metabolic acidosis due to AKI: Monitor labs.  Bicarb in  19  Complicated UTI in the setting of obstructive uropathy: For now continue empiric ceftriaxone . Urine culture ordered but unable to be sent yet will likely be low yield now  Hyperkalemia: Resolved.  Discontinue Lokelma    Hyperphosphatemia: Monitor electrolytes  Hyponatremia: Likely in the setting of AKI.  Monitor   DVT prophylaxis: SCDs Start: 11/17/23 2241 Code Status:   Code Status: Full Code Family Communication: plan of care discussed with patient at bedside. Patient status is: Remains hospitalized because of severity of illness Level of care: Telemetry   Dispo: The patient is from: home            Anticipated disposition: TBD Objective: Vitals last 24 hrs: Vitals:   11/18/23 1351 11/18/23 2125 11/19/23 0458 11/19/23 1322  BP: 127/87 132/87 (!) 144/94 (!) 176/95  Pulse: (!) 54 (!) 54 67 65  Resp: 20 17 17 16   Temp: 98.2 F (36.8 C) 97.9 F (36.6 C) 98.2 F (36.8 C) (!) 97.3 F (36.3 C)  TempSrc: Oral Oral Oral Oral  SpO2: 98% 98% 99% 100%  Weight:        Physical Examination: General exam: alert awake, older than stated age HEENT:Oral mucosa moist, Ear/Nose WNL grossly Respiratory system: Bilaterally diminished BS, no use of accessory muscle Cardiovascular system: S1 & S2 +. Gastrointestinal system: Abdomen soft, NT,ND,BS+ Nervous System: Alert, awake,following commands. Extremities: LE edema neg, moving arms legs, warm legs Skin: No rashes,warm. MSK: Normal muscle bulk/tone.   Data Reviewed: I have personally reviewed following labs and imaging studies ( see epic result tab) CBC: Recent Labs  Lab 11/17/23 1225 11/17/23 1720 11/18/23 0556 11/19/23 0612  WBC 7.1  --  6.9 7.7  NEUTROABS 5.5  --   --   --   HGB 13.1 11.9* 13.0 12.7*  HCT 36.9* 35.0* 38.6* 38.2*  MCV 86.2  --  90.8 91.4  PLT 156  --  165 173   CMP: Recent Labs  Lab 11/17/23 1225 11/17/23 1720 11/18/23 0556 11/19/23 0612  NA 127* 129* 132* 133*  K 5.4* 4.8 5.2* 4.0  CL 95*  101 102 102  CO2 21*  --  20* 19*  GLUCOSE 92 88 116* 101*  BUN 54* 47* 62* 59*  CREATININE 6.31* 7.10* 5.98* 5.01*  CALCIUM 8.8*  --  8.2* 8.1*  MG  --   --  2.1  --   PHOS 5.3*  --  6.7*  --    GFR: CrCl cannot be calculated (Unknown ideal weight.). Recent Labs  Lab 11/17/23 1225  AST 11*  ALT 12  ALKPHOS 50  BILITOT 0.9  PROT 6.6  ALBUMIN 3.9    Recent Labs  Lab 11/17/23 1225  LIPASE 21   No results for input(s): "AMMONIA" in the last 168 hours. Coagulation Profile:  Recent Labs  Lab 11/19/23 0612  INR 1.0   Medications reviewed:  Scheduled Meds:  tamsulosin   0.4 mg Oral Daily   Continuous Infusions:  cefTRIAXone  (ROCEPHIN )  IV 1 g (11/18/23 2316)   lactated ringers  100 mL/hr at 11/19/23 1041    Total time spent in review of labs and imaging, patient evaluation, formulation of plan, documentation and communication with patient/family: 50 minutes  Lesa Rape, MD [facility] 11/19/2023, 2:02 PM

## 2023-11-19 NOTE — Procedures (Signed)
 Interventional Radiology Procedure Note  Procedure: Image guided right percutaneous nephrostomy tube placement   Findings: Please refer to procedural dictation for full description. 10 Fr via right inferior pole calyx, to bag drainage.  Complications: None immediate  Estimated Blood Loss: < 5 mL  Recommendations: Keep to bag drainage. IR will follow.   Creasie Doctor, MD

## 2023-11-19 NOTE — Plan of Care (Signed)

## 2023-11-20 ENCOUNTER — Other Ambulatory Visit (HOSPITAL_COMMUNITY): Payer: Self-pay

## 2023-11-20 DIAGNOSIS — N179 Acute kidney failure, unspecified: Secondary | ICD-10-CM | POA: Diagnosis not present

## 2023-11-20 LAB — CBC
HCT: 35.9 % — ABNORMAL LOW (ref 39.0–52.0)
Hemoglobin: 12 g/dL — ABNORMAL LOW (ref 13.0–17.0)
MCH: 30.7 pg (ref 26.0–34.0)
MCHC: 33.4 g/dL (ref 30.0–36.0)
MCV: 91.8 fL (ref 80.0–100.0)
Platelets: 169 10*3/uL (ref 150–400)
RBC: 3.91 MIL/uL — ABNORMAL LOW (ref 4.22–5.81)
RDW: 12.3 % (ref 11.5–15.5)
WBC: 6.5 10*3/uL (ref 4.0–10.5)
nRBC: 0 % (ref 0.0–0.2)

## 2023-11-20 LAB — BASIC METABOLIC PANEL WITH GFR
Anion gap: 12 (ref 5–15)
BUN: 43 mg/dL — ABNORMAL HIGH (ref 6–20)
CO2: 24 mmol/L (ref 22–32)
Calcium: 8.4 mg/dL — ABNORMAL LOW (ref 8.9–10.3)
Chloride: 103 mmol/L (ref 98–111)
Creatinine, Ser: 3.24 mg/dL — ABNORMAL HIGH (ref 0.61–1.24)
GFR, Estimated: 22 mL/min — ABNORMAL LOW (ref >60)
Glucose, Bld: 107 mg/dL — ABNORMAL HIGH (ref 70–99)
Potassium: 4.1 mmol/L (ref 3.5–5.1)
Sodium: 139 mmol/L (ref 135–145)

## 2023-11-20 MED ORDER — CEFADROXIL 500 MG PO CAPS
500.0000 mg | ORAL_CAPSULE | Freq: Every day | ORAL | 0 refills | Status: AC
  Filled 2023-11-20: qty 4, 4d supply, fill #0

## 2023-11-20 MED ORDER — TAMSULOSIN HCL 0.4 MG PO CAPS
0.4000 mg | ORAL_CAPSULE | Freq: Every day | ORAL | 0 refills | Status: DC
  Filled 2023-11-20: qty 30, 30d supply, fill #0

## 2023-11-20 NOTE — Plan of Care (Signed)

## 2023-11-20 NOTE — Progress Notes (Signed)
 3 Days Post-Op Subjective: First time meeting patient and his wife.  Reviewed the natural history of his stone development and proposed procedures.  All questions were answered to their satisfaction.  PCN T has been placed and is draining well.  Will  Objective: Vital signs in last 24 hours: Temp:  [97.3 F (36.3 C)-98.6 F (37 C)] 98.1 F (36.7 C) (04/23 0507) Pulse Rate:  [58-100] 58 (04/23 0507) Resp:  [9-20] 18 (04/23 0507) BP: (114-176)/(74-95) 128/81 (04/23 0507) SpO2:  [93 %-100 %] 99 % (04/23 0507)  Assessment/Plan: # Right staghorn stone # Distal left ureteral stones # AKI  Right percutaneous nephrolithotomy on an outpatient basis.  Possibly staged treatment. S/p left ureteral stent placement with Dr.Machen 08/19/2023.  Ureteroscopy with laser lithotripsy on an outpatient basis. Continue to trend labs.  Patient has made significant progress since his kidneys have been decompressed.  Still has a significant AKI but is making large expected daily improvements. Okay to discharge from urologic perspective once cleared medically.  Intake/Output from previous day: 04/22 0701 - 04/23 0700 In: 240 [P.O.:240] Out: 1225 [Urine:1225]  Intake/Output this shift: No intake/output data recorded.  Physical Exam:  General: Alert and oriented CV: No cyanosis Lungs: equal chest rise Gu: Right side percutaneous nephrostomy tube in place draining clear, lightly blood-tinged urine.  Lab Results: Recent Labs    11/18/23 0556 11/19/23 0612 11/20/23 0613  HGB 13.0 12.7* 12.0*  HCT 38.6* 38.2* 35.9*   BMET Recent Labs    11/19/23 0612 11/20/23 0613  NA 133* 139  K 4.0 4.1  CL 102 103  CO2 19* 24  GLUCOSE 101* 107*  BUN 59* 43*  CREATININE 5.01* 3.24*  CALCIUM 8.1* 8.4*     Studies/Results: IR NEPHROSTOMY PLACEMENT RIGHT Result Date: 11/19/2023 INDICATION: 58 year old male with history of right staghorn calculus. EXAM: 1. ULTRASOUND GUIDANCE FOR PUNCTURE OF THE  RIGHT RENAL COLLECTING SYSTEM 2. RIGHT PERCUTANEOUS NEPHROSTOMY TUBE PLACEMENT. COMPARISON:  11/17/2023 MEDICATIONS: Rocephin  2 gm IV; The antibiotic was administered in an appropriate time frame prior to skin puncture. ANESTHESIA/SEDATION: Moderate (conscious) sedation was employed during this procedure. A total of Versed  3 mg and Fentanyl  100 mcg was administered intravenously. Moderate Sedation Time: 19 minutes. The patient's level of consciousness and vital signs were monitored continuously by radiology nursing throughout the procedure under my direct supervision. CONTRAST:  Twenty mL Isovue 300 - administered into the renal collecting system FLUOROSCOPY TIME:  minutes  seconds. COMPLICATIONS: None immediate. PROCEDURE: The procedure, risks, benefits, and alternatives were explained to the patient. Questions regarding the procedure were encouraged and answered. The patient understands and consents to the procedure. A timeout was performed prior to the initiation of the procedure. The right flank region was prepped and draped in the usual sterile fashion and a sterile drape was applied covering the operative field. A sterile gown and sterile gloves were used for the procedure. Local anesthesia was provided with 1% Lidocaine  with epinephrine . Ultrasound was used to localize the right kidney. Under direct ultrasound guidance, a 20 gauge needle was advanced into the renal collecting system. An ultrasound image documentation was performed. Access within the collecting system was confirmed with the efflux of urine followed by limited contrast injection. Over a Nitrex wire, the tract was dilated with an Accustick stent. Next, under intermittent fluoroscpic guidance and over a short Amplatz wire, the track was dilated ultimately allowing placement of a 10-French percutaneous nephrostomy catheter which was advanced to the level of the renal pelvis where  the coil was formed and locked. Contrast was injected and several  spot fluoroscopic images were obtained in various obliquities. The catheter was secured at the skin with a Prolene retention suture and stat lock device and connected to a gravity bag was placed. Dressings were applied. The patient tolerated procedure well without immediate postprocedural complication. FINDINGS: Ultrasound scanning demonstrates a moderate to severely dilated right collecting system. Under a combination of ultrasound and fluoroscopic guidance, a posterior inferior calix was targeted allowing placement of a 10-French percutaneous nephrostomy catheter with end coiled and locked within the renal pelvis. Contrast injection confirmed appropriate positioning. IMPRESSION: Successful ultrasound and fluoroscopic guided placement of a right sided 10 French percutaneous nephrostomy. Creasie Doctor, MD [facility] Radiology Electronically Signed   By: Creasie Doctor M.D.   On: 11/19/2023 20:45      LOS: 3 days   Alla Ar, NP [facility] Pager: [phone]  11/20/2023, 9:32 AM

## 2023-11-20 NOTE — Discharge Instructions (Signed)
[facility] [phone] Post  Stent Instructions  Definitions:  Ureter: The duct that transports urine from the kidney to the bladder. Stent:   A plastic hollow tube that is placed into the ureter, from the kidney to the bladder to prevent the ureter from swelling shut.  GENERAL INSTRUCTIONS:  Despite the fact that no skin incisions were used, the area around the ureter and bladder is raw and irritated. The stent is a foreign body which will further irritate the bladder wall. This irritation is manifested by increased frequency of urination, both day and night, and by an increase in the urge to urinate. In some, the urge to urinate is present almost always. Sometimes the urge is strong enough that you may not be able to stop yourself from urinating. The only real cure is to remove the stent and then give time for the bladder wall to heal which can't be done until the danger of the ureter swelling shut has passed, which varies.  You may see some blood in your urine while the stent is in place and a few days afterwards. Do not be alarmed, even if the urine was clear for a while. Get off your feet and drink lots of fluids until clearing occurs. If you start to pass clots or don't improve, call us.  DIET: You may return to your normal diet immediately. Because of the raw surface of your bladder, alcohol, spicy foods, acid type foods and drinks with caffeine may cause irritation or frequency and should be used in moderation. To keep your urine flowing freely and to avoid constipation, drink plenty of fluids during the day ( 8-10 glasses ). Tip: Avoid cranberry juice because it is very acidic.  ACTIVITY: Your physical activity doesn't need to be restricted. However, if you are very active, you may see some blood in your urine. We suggest that you reduce your activity under these circumstances until the bleeding has stopped.  BOWELS: It is important to keep your bowels regular  during the postoperative period. Straining with bowel movements can cause bleeding. A bowel movement every other day is reasonable. Use a mild laxative if needed, such as Milk of Magnesia 2-3 tablespoons, or 2 Dulcolax tablets. Call if you continue to have problems. If you have been taking narcotics for pain, before, during or after your surgery, you may be constipated. Take a laxative if necessary.   MEDICATION: You should resume your pre-surgery medications unless told not to. In addition you will often be given an antibiotic to prevent infection and likely several as needed medications for stent related discomfort. These should be taken as prescribed until the bottles are finished unless you are having an unusual reaction to one of the drugs.  PROBLEMS YOU SHOULD REPORT TO [place]: Fevers over 100.5 Fahrenheit. Heavy bleeding, or clots ( See above notes about blood in urine ). Inability to urinate. Drug reactions ( hives, rash, nausea, vomiting, diarrhea ). Severe burning or pain with urination that is not improving.

## 2023-11-20 NOTE — Plan of Care (Signed)

## 2023-11-20 NOTE — Discharge Summary (Signed)
 Physician Discharge Summary  Dustin Todd UXL:[id number] DOB: 07/15/1966 DOA: 11/17/2023  PCP: Pcp, No  Admit date: 11/17/2023 Discharge date: 11/20/2023 Recommendations for Outpatient Follow-up:  Follow up with PCP in 1 weeks-call for appointment Please obtain BMP/CBC in one week Follow-up with [facility] Dr Jarvis Mesa  Discharge Dispo: Home Discharge Condition: Stable Code Status:   Code Status: Full Code Diet recommendation:  Diet Order             Diet regular Room service appropriate? Yes; Fluid consistency: Thin  Diet effective now                    Brief/Interim Summary: 58 y.o. male with  hx of cluster headaches, recurrent epistaxis followed by ENT, nephrolithiasis, who presented to the ER with complaints of abdominal pain and distention, progressively worsening for the past 2 weeks, with diarrhea but no nausea or vomiting or overt bleeding.  In the ED: had elevated creatinine 6.31, above baseline of 0.96. 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  Urology was consulted and underwent OR w/ cystoscopy left ureteral stent placement.  Urology recommended IR place nephrostomy tube-done 4/22, following which creatinine nicely improving plan is for outpatient definitive stone management.Urology has cleared the patient for discharge Discussed with Dr. Gwenevere Lent and he is agreeable for discharge home with 2 more days of oral antibiotics will arrange outpatient follow-up  Subjective: Seen and examined this morning Resting comfortably no new complaints voiding well Urine pinkish in color and nephrostomy tube Patient feels comfortable going home today  Discharge diagnosis: AKI-obstructive  uropathy Left hydronephrosis Left ureteral stone Right staghorn calculus: Urology following- s/p cystoscopy and left ureteral stent, plan for IR right nephrostomy tube 4/22-done o n4/22 and eventually per Uro will plan right PCNL. Now S/P on improving, creat at 3.2-voiding well advised to avoid NSAIDs pain is controlled.Urology cleared for discharge.  If okay with IR will plan for discharge with instruction for drain management.  Encourage to keep appointment Loss Recent Labs    02/28/23 0037 11/17/23 1225 11/17/23 1720 11/18/23 0556 11/19/23 0612 11/20/23 0613  BUN 14 54* 47* 62* 59* 43*  CREATININE 0.96 6.31* 7.10* 5.98* 5.01* 3.24*  CO2 20* 21*  --  20* 19* 24  K 3.8 5.4* 4.8 5.2* 4.0 4.1    Metabolic acidosis due to AKI: Resolved   Complicated UTI in the setting of obstructive uropathy: S/p ceftriaxone . Changed to po on d/c  Hyperkalemia: Resolved.    Hyperphosphatemia: Monitor electrolytes.  Follow-up outpatient  Hyponatremia: Likely in the setting of AKI.  Resolved    Consults: Urology IR  Discharge Exam: Vitals:   11/19/23 2110 11/20/23 0507  BP: 114/80 128/81  Pulse: (!) 58 (!) 58  Resp: 18 18  Temp: 98.1 F (36.7 C) 98.1 F (36.7 C)  SpO2: 98% 99%   General: Pt is alert, awake, not in acute distress Cardiovascular: RRR, S1/S2 +, no rubs, no gallops Respiratory: CTA bilaterally, no wheezing, no rhonchi Abdominal: Soft, NT, ND, bowel sounds + Extremities: no edema, no cyanosis  Discharge Instructions  Discharge Instructions     Discharge instructions   Complete by: As directed    Please call call MD or return to ER for  similar or worsening recurring problem that brought you to hospital or if any fever,nausea/vomiting,abdominal pain, uncontrolled pain, chest pain,  shortness of breath or any other alarming symptoms.  Please follow-up your doctor as instructed in a week time and call the office for appointment.  Please avoid alcohol, smoking, or  any other illicit substance and maintain healthy habits including taking your regular medications as prescribed.  You were cared for by a hospitalist during your hospital stay. If you have any questions about your discharge medications or the care you received while you were in the hospital after you are discharged, you can call the unit and ask to speak with the hospitalist on call if the hospitalist that took care of you is not available.  Once you are discharged, your primary care physician will handle any further medical issues. Please note that NO REFILLS for any discharge medications will be authorized once you are discharged, as it is imperative that you return to your primary care physician (or establish a relationship with a primary care physician if you do not have one) for your aftercare needs so that they can reassess your need for medications and monitor your lab values   Increase activity slowly   Complete by: As directed       Allergies as of 11/20/2023       Reactions   Gluten Meal Other (See Comments)   Break outs, stomach upset   Penicillins Rash        Medication List     TAKE these medications    azelastine  0.1 % nasal spray Commonly known as: ASTELIN  Place 2 sprays into both nostrils 2 (two) times daily. Use in each nostril as directed   cefadroxil  500 MG capsule Commonly known as: DURICEF Take 1 capsule (500 mg total) by mouth daily for 4 days.   cetirizine  10 MG tablet Commonly known as: ZYRTEC  Take 1 tablet (10 mg total) by mouth daily. What changed:  when to take this reasons to take this   tamsulosin  0.4 MG Caps capsule Commonly known as: FLOMAX  Take 1 capsule (0.4 mg total) by mouth daily. Start taking on: November 21, 2023        Follow-up Information     Mallie Seal, MD Follow up in 1 week(s).   Specialty: Urology Why: call office for follow up plan on stone managenet Contact information: [address] [place]  [address] [phone]                Allergies  Allergen Reactions   Gluten Meal Other (See Comments)    Break outs, stomach upset   Penicillins Rash    The results of significant diagnostics from this hospitalization (including imaging, microbiology, ancillary and laboratory) are listed below for reference.    Microbiology: Recent Results (from the past 240 hours)  Urine Culture (for pregnant, neutropenic or urologic patients or patients with an indwelling urinary catheter)     Status: None   Collection Time: 11/18/23  8:15 PM   Specimen: Urine, Clean Catch  Result Value Ref Range Status   Specimen Description   Final    URINE, CLEAN CATCH Performed at [facility], [address] [place], [place] [address]    Special Requests   Final    NONE Performed at [facility], [address], [place], [place] [address]    Culture   Final    NO GROWTH Performed at [facility], [address], [place], [place] [address]    Report Status 11/19/2023 FINAL  Final    Procedures/Studies: IR NEPHROSTOMY PLACEMENT RIGHT Result Date: 11/19/2023 INDICATION: 59 year old male with history of right staghorn calculus. EXAM: 1. ULTRASOUND GUIDANCE FOR PUNCTURE OF THE RIGHT RENAL COLLECTING SYSTEM 2. RIGHT PERCUTANEOUS NEPHROSTOMY TUBE PLACEMENT. COMPARISON:  11/17/2023 MEDICATIONS: Rocephin  2 gm IV; The antibiotic was administered in an appropriate time frame prior to skin puncture. ANESTHESIA/SEDATION: Moderate (conscious) sedation was employed during this procedure. A total of Versed  3 mg and Fentanyl  100 mcg was administered intravenously. Moderate Sedation Time: 19 minutes. The patient's level of consciousness and vital signs were monitored continuously by radiology nursing throughout the procedure under my direct supervision. CONTRAST:  Twenty mL Isovue 300 - administered into the renal collecting system FLUOROSCOPY TIME:  minutes  seconds.  COMPLICATIONS: None immediate. PROCEDURE: The procedure, risks, benefits, and alternatives were explained to the patient. Questions regarding the procedure were encouraged and answered. The patient understands and consents to the procedure. A timeout was performed prior to the initiation of the procedure. The right flank region was prepped and draped in the usual sterile fashion and a sterile drape was applied covering the operative field. A sterile gown and sterile gloves were used for the procedure. Local anesthesia was provided with 1% Lidocaine  with epinephrine . Ultrasound was used to localize the right kidney. Under direct ultrasound guidance, a 20 gauge needle was advanced into the renal collecting system. An ultrasound image documentation was performed. Access within the collecting system was confirmed with the efflux of urine followed by limited contrast injection. Over a Nitrex wire, the tract was dilated with an Accustick stent. Next, under intermittent fluoroscpic guidance and over a short Amplatz wire, the track was dilated ultimately allowing placement of a 10-French percutaneous nephrostomy catheter which was advanced to the level of the renal pelvis where the coil was formed and locked. Contrast was injected and several spot fluoroscopic images were obtained in various obliquities. The catheter was secured at the skin with a Prolene retention suture and stat lock device and connected to a gravity bag was placed. Dressings were applied. The patient tolerated procedure well without immediate postprocedural complication. FINDINGS: Ultrasound scanning demonstrates a moderate to severely dilated right collecting system. Under a combination of ultrasound and fluoroscopic guidance, a posterior inferior calix was targeted allowing placement of a 10-French percutaneous nephrostomy catheter with end coiled and locked within the renal pelvis. Contrast injection confirmed appropriate positioning. IMPRESSION:  Successful ultrasound and fluoroscopic guided placement of a right sided 10 French percutaneous nephrostomy. Creasie Doctor, MD [facility] Radiology Electronically Signed   By: Creasie Doctor M.D.   On: 11/19/2023 20:45   DG C-Arm 1-60 Min-No Report Result Date: 11/17/2023 Fluoroscopy was utilized by the requesting physician.  No radiographic interpretation.   CT ABDOMEN PELVIS WO CONTRAST Result Date: 11/17/2023 CLINICAL DATA:  Abdominal pain and distension for 2 weeks. Difficulty urinating for 2 days. EXAM: CT ABDOMEN AND PELVIS WITHOUT CONTRAST TECHNIQUE: Multidetector CT imaging of the abdomen and pelvis was performed following the standard protocol without IV contrast. RADIATION DOSE REDUCTION: This exam was performed according to the departmental dose-optimization program which includes automated exposure control, adjustment of the mA and/or kV according to patient size and/or use of iterative reconstruction technique. COMPARISON:  Abdominal ultrasound 03/11/2009 FINDINGS: Lower chest: Mild dependent atelectasis is present at the lung bases. The lungs are otherwise clear. The heart size is normal. No significant pleural  or pericardial effusion is present. Hepatobiliary: Previously described hepatic lesions are not evident on this noncontrast study. The liver is within normal limits. Common bile duct and gallbladder are normal. Pancreas: Unremarkable. No pancreatic ductal dilatation or surrounding inflammatory changes. Spleen: Normal in size without focal abnormality. The adrenal glands are normal bilaterally. Adrenals/Urinary Tract: A staghorn calculus is present in the right kidney with obstructing component extending to the right UPJ. The more distal ureter is within normal limits. Mild inflammatory changes surround the right kidney. A 7 mm nonobstructing stone is present the lower pole of the left kidney. Moderate left hydronephrosis present. The ureter  is dilated. Two distal left ureteral stones are lodged just above the UVJ measuring 7 and 11 mm respectively. The urinary bladder is mostly collapsed. Stomach/Bowel: The stomach and duodenum are within normal limits. The small bowel is unremarkable. Terminal ileum is normal. The appendix is visualized and normal. The ascending and transverse colon are within normal limits. The descending and sigmoid colon are normal. Vascular/Lymphatic: Minimal atherosclerotic calcifications are present in the aorta and branch vessels. No aneurysm is present. No significant adenopathy is present. Reproductive: Status post hysterectomy. No adnexal masses. Other: No abdominal wall hernia or abnormality. No abdominopelvic ascites. Musculoskeletal: The vertebral body heights alignment are normal. Vacuum disc is present at L5-S1. No focal osseous lesions are present. Bony pelvis is normal. The hips are located and within normal limits. IMPRESSION: 1. Two distal left ureteral stones are lodged just above the UVJ measuring 7 and 11 mm result in moderate left-sided hydronephrosis and dilation of the ureter. 2. 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 3. 7 mm nonobstructing stone in the lower pole of the left kidney. 4. Previously described hepatic lesions are not evident on this noncontrast study. Electronically Signed   By: Audree Leas M.D.   On: 11/17/2023 14:57    Labs: BNP (last 3 results) No results for input(s): "BNP" in the last 8760 hours. Basic Metabolic Panel: Recent Labs  Lab 11/17/23 1225 11/17/23 1720 11/18/23 0556 11/19/23 0612 11/20/23 0613  NA 127* 129* 132* 133* 139  K 5.4* 4.8 5.2* 4.0 4.1  CL 95* 101 102 102 103  CO2 21*  --  20* 19* 24  GLUCOSE 92 88 116* 101* 107*  BUN 54* 47* 62* 59* 43*  CREATININE 6.31* 7.10* 5.98* 5.01* 3.24*  CALCIUM 8.8*  --  8.2* 8.1* 8.4*  MG  --   --  2.1  --   --   PHOS 5.3*  --  6.7*  --   --    Liver  Function Tests: Recent Labs  Lab 11/17/23 1225  AST 11*  ALT 12  ALKPHOS 50  BILITOT 0.9  PROT 6.6  ALBUMIN 3.9   Recent Labs  Lab 11/17/23 1225  LIPASE 21   No results for input(s): "AMMONIA" in the last 168 hours. CBC: Recent Labs  Lab 11/17/23 1225 11/17/23 1720 11/18/23 0556 11/19/23 0612 11/20/23 0613  WBC 7.1  --  6.9 7.7 6.5  NEUTROABS 5.5  --   --   --   --   HGB 13.1 11.9* 13.0 12.7* 12.0*  HCT 36.9* 35.0* 38.6* 38.2* 35.9*  MCV 86.2  --  90.8 91.4 91.8  PLT 156  --  165 173 169      Component Value Date/Time   COLORURINE COLORLESS (A) 11/17/2023 1415   APPEARANCEUR CLEAR 11/17/2023 1415   LABSPEC 1.005 11/17/2023 1415  PHURINE 6.5 11/17/2023 1415   GLUCOSEU NEGATIVE 11/17/2023 1415   HGBUR MODERATE (A) 11/17/2023 1415   BILIRUBINUR NEGATIVE 11/17/2023 1415   KETONESUR NEGATIVE 11/17/2023 1415   PROTEINUR NEGATIVE 11/17/2023 1415   NITRITE NEGATIVE 11/17/2023 1415   LEUKOCYTESUR MODERATE (A) 11/17/2023 1415   Sepsis Labs Recent Labs  Lab 11/17/23 1225 11/18/23 0556 11/19/23 0612 11/20/23 0613  WBC 7.1 6.9 7.7 6.5   Microbiology Recent Results (from the past 240 hours)  Urine Culture (for pregnant, neutropenic or urologic patients or patients with an indwelling urinary catheter)     Status: None   Collection Time: 11/18/23  8:15 PM   Specimen: Urine, Clean Catch  Result Value Ref Range Status   Specimen Description   Final    URINE, CLEAN CATCH Performed at [facility], [address], [place], [place] [address]    Special Requests   Final    NONE Performed at [facility], [address] [place], [place] [address]    Culture   Final    NO GROWTH Performed at [facility], [address], [place], [place] [address]    Report Status 11/19/2023 FINAL  Final   Time coordinating discharge: 35 minutes  SIGNED: Lesa Rape, MD  [facility] 11/20/2023, 12:56 PM  If 7PM-7AM, please  contact night-coverage [email or web address]

## 2023-11-21 ENCOUNTER — Other Ambulatory Visit (HOSPITAL_COMMUNITY): Payer: Self-pay

## 2023-11-27 ENCOUNTER — Other Ambulatory Visit (HOSPITAL_COMMUNITY): Payer: Self-pay | Admitting: Urology

## 2023-11-27 DIAGNOSIS — N200 Calculus of kidney: Secondary | ICD-10-CM

## 2023-11-28 ENCOUNTER — Other Ambulatory Visit: Payer: Self-pay | Admitting: Urology

## 2023-12-05 ENCOUNTER — Other Ambulatory Visit: Payer: Self-pay | Admitting: Radiology

## 2023-12-05 DIAGNOSIS — N200 Calculus of kidney: Secondary | ICD-10-CM

## 2023-12-05 NOTE — H&P (Signed)
 Chief Complaint: Right staghorn renal calculus/hydronephrosis, status post right percutaneous nephrostomy on 11/19/23; referred for conversion of nephrostomy to nephroureteral catheter prior to nephrolithotomy in June 2025  Referring Provider(s): Gay,M  Supervising Physician: Elene Griffes  Patient Status: [facility] - Out-pt  History of Present Illness: Dustin Todd is a 58 y.o. male with history of nephrolithiasis and recent admission to [facility] for abdominal pain with associated obstructive uropathy/AKI, left hydronephrosis, left ureteral stone and right staghorn calculus.  He is status post left ureteral stent placement on 4/20 and right percutaneous nephrostomy on 11/19/2023.  He presents now for conversion of right nephrostomy to nephroureteral catheter prior to nephrolithotomy in June 2025.   Patient is Full Code  Past Medical History:  Diagnosis Date   Kidney stone     Past Surgical History:  Procedure Laterality Date   CYSTOSCOPY WITH STENT PLACEMENT Left 11/17/2023   Procedure: CYSTOSCOPY, WITH STENT INSERTION;  Surgeon: Mallie Seal, MD;  Location: [facility];  Service: Urology;  Laterality: Left;   IR NEPHROSTOMY PLACEMENT RIGHT  11/19/2023   LITHOTRIPSY      Allergies: Gluten meal and Penicillins  Medications: Prior to Admission medications   Medication Sig Start Date End Date Taking? Authorizing Provider  cetirizine  (ZYRTEC ) 10 MG tablet Take 1 tablet (10 mg total) by mouth daily. Patient taking differently: Take 10 mg by mouth daily as needed for allergies. 04/18/23   Soldatova, [place], MD  tamsulosin  (FLOMAX ) 0.4 MG CAPS capsule Take 1 capsule (0.4 mg total) by mouth daily. Patient taking differently: Take 0.4 mg by mouth daily with lunch. 11/21/23 12/21/23  Lesa Rape, MD     Family History  Problem Relation Age of Onset   Healthy Mother    Heart disease Father        pacemaker   Cancer Maternal Grandmother        Breast   Cancer Paternal  Grandfather        Bone    Social History   Socioeconomic History   Marital status: Married    Spouse name: Not on file   Number of children: 2   Years of education: college   Highest education level: Not on file  Occupational History   Occupation: self employed Architect  Tobacco Use   Smoking status: Never   Smokeless tobacco: Never  Vaping Use   Vaping status: Never Used  Substance and Sexual Activity   Alcohol use: Not Currently    Comment: occasional   Drug use: No   Sexual activity: Not on file  Other Topics Concern   Not on file  Social History Narrative   Married.   2 children (boy and girl).   Works at an [employer]   Enjoys going to [employer], golfing, basketball.   Right-handed.   One cup caffeine per day.   Social Drivers of Corporate investment banker Strain: Not on file  Food Insecurity: No Food Insecurity (11/17/2023)   Hunger Vital Sign    Worried About Running Out of Food in the Last Year: Never true    Ran Out of Food in the Last Year: Never true  Transportation Needs: No Transportation Needs (11/19/2023)   PRAPARE - Administrator, Civil Service (Medical): No    Lack of Transportation (Non-Medical): No  Physical Activity: Not on file  Stress: Not on file  Social Connections: Not on file       Review of Systems denies fever,  headache, chest pain, dyspnea, cough, abdominal/back pain, nausea, vomiting or bleeding  Vital Signs: Vitals:   12/06/23 1142  BP: 124/81  Pulse: (!) 59  Resp: 16  Temp: 97.9 F (36.6 C)  SpO2: 98%      Advance Care Plan: No documents on file  Physical Exam awake, alert.  Chest clear to auscultation bilaterally.  Heart with regular rate and rhythm.  Abdomen soft, positive bowel sounds, nontender, right nephrostomy intact, insertion site okay, yellow urine in drain bag; no significant lower extremity edema.  Imaging: IR NEPHROSTOMY PLACEMENT RIGHT Result Date: 11/19/2023 INDICATION:  58 year old male with history of right staghorn calculus. EXAM: 1. ULTRASOUND GUIDANCE FOR PUNCTURE OF THE RIGHT RENAL COLLECTING SYSTEM 2. RIGHT PERCUTANEOUS NEPHROSTOMY TUBE PLACEMENT. COMPARISON:  11/17/2023 MEDICATIONS: Rocephin  2 gm IV; The antibiotic was administered in an appropriate time frame prior to skin puncture. ANESTHESIA/SEDATION: Moderate (conscious) sedation was employed during this procedure. A total of Versed  3 mg and Fentanyl  100 mcg was administered intravenously. Moderate Sedation Time: 19 minutes. The patient's level of consciousness and vital signs were monitored continuously by radiology nursing throughout the procedure under my direct supervision. CONTRAST:  Twenty mL Isovue 300 - administered into the renal collecting system FLUOROSCOPY TIME:  minutes  seconds. COMPLICATIONS: None immediate. PROCEDURE: The procedure, risks, benefits, and alternatives were explained to the patient. Questions regarding the procedure were encouraged and answered. The patient understands and consents to the procedure. A timeout was performed prior to the initiation of the procedure. The right flank region was prepped and draped in the usual sterile fashion and a sterile drape was applied covering the operative field. A sterile gown and sterile gloves were used for the procedure. Local anesthesia was provided with 1% Lidocaine  with epinephrine . Ultrasound was used to localize the right kidney. Under direct ultrasound guidance, a 20 gauge needle was advanced into the renal collecting system. An ultrasound image documentation was performed. Access within the collecting system was confirmed with the efflux of urine followed by limited contrast injection. Over a Nitrex wire, the tract was dilated with an Accustick stent. Next, under intermittent fluoroscpic guidance and over a short Amplatz wire, the track was dilated ultimately allowing placement of a 10-French percutaneous nephrostomy catheter which was advanced  to the level of the renal pelvis where the coil was formed and locked. Contrast was injected and several spot fluoroscopic images were obtained in various obliquities. The catheter was secured at the skin with a Prolene retention suture and stat lock device and connected to a gravity bag was placed. Dressings were applied. The patient tolerated procedure well without immediate postprocedural complication. FINDINGS: Ultrasound scanning demonstrates a moderate to severely dilated right collecting system. Under a combination of ultrasound and fluoroscopic guidance, a posterior inferior calix was targeted allowing placement of a 10-French percutaneous nephrostomy catheter with end coiled and locked within the renal pelvis. Contrast injection confirmed appropriate positioning. IMPRESSION: Successful ultrasound and fluoroscopic guided placement of a right sided 10 French percutaneous nephrostomy. Creasie Doctor, MD [facility] Electronically Signed   By: Creasie Doctor M.D.   On: 11/19/2023 20:45   DG C-Arm 1-60 Min-No Report Result Date: 11/17/2023 Fluoroscopy was utilized by the requesting physician.  No radiographic interpretation.   CT ABDOMEN PELVIS WO CONTRAST Result Date: 11/17/2023 CLINICAL DATA:  Abdominal pain and distension for 2 weeks. Difficulty urinating for 2 days. EXAM: CT ABDOMEN AND PELVIS WITHOUT CONTRAST TECHNIQUE: Multidetector CT imaging of the abdomen and pelvis was  performed following the standard protocol without IV contrast. RADIATION DOSE REDUCTION: This exam was performed according to the departmental dose-optimization program which includes automated exposure control, adjustment of the mA and/or kV according to patient size and/or use of iterative reconstruction technique. COMPARISON:  Abdominal ultrasound 03/11/2009 FINDINGS: Lower chest: Mild dependent atelectasis is present at the lung bases. The lungs are otherwise clear. The  heart size is normal. No significant pleural or pericardial effusion is present. Hepatobiliary: Previously described hepatic lesions are not evident on this noncontrast study. The liver is within normal limits. Common bile duct and gallbladder are normal. Pancreas: Unremarkable. No pancreatic ductal dilatation or surrounding inflammatory changes. Spleen: Normal in size without focal abnormality. The adrenal glands are normal bilaterally. Adrenals/Urinary Tract: A staghorn calculus is present in the right kidney with obstructing component extending to the right UPJ. The more distal ureter is within normal limits. Mild inflammatory changes surround the right kidney. A 7 mm nonobstructing stone is present the lower pole of the left kidney. Moderate left hydronephrosis present. The ureter is dilated. Two distal left ureteral stones are lodged just above the UVJ measuring 7 and 11 mm respectively. The urinary bladder is mostly collapsed. Stomach/Bowel: The stomach and duodenum are within normal limits. The small bowel is unremarkable. Terminal ileum is normal. The appendix is visualized and normal. The ascending and transverse colon are within normal limits. The descending and sigmoid colon are normal. Vascular/Lymphatic: Minimal atherosclerotic calcifications are present in the aorta and branch vessels. No aneurysm is present. No significant adenopathy is present. Reproductive: Status post hysterectomy. No adnexal masses. Other: No abdominal wall hernia or abnormality. No abdominopelvic ascites. Musculoskeletal: The vertebral body heights alignment are normal. Vacuum disc is present at L5-S1. No focal osseous lesions are present. Bony pelvis is normal. The hips are located and within normal limits. IMPRESSION: 1. Two distal left ureteral stones are lodged just above the UVJ measuring 7 and 11 mm result in moderate left-sided hydronephrosis and dilation of the ureter. 2. Staghorn calculus in the right kidney with  obstructing component extending to the right UPJ. 3. 7 mm nonobstructing stone in the lower pole of the left kidney. 4. Previously described hepatic lesions are not evident on this noncontrast study. Electronically Signed   By: Audree Leas M.D.   On: 11/17/2023 14:57    Labs:  CBC: Recent Labs    11/17/23 1225 11/17/23 1720 11/18/23 0556 11/19/23 0612 11/20/23 0613  WBC 7.1  --  6.9 7.7 6.5  HGB 13.1 11.9* 13.0 12.7* 12.0*  HCT 36.9* 35.0* 38.6* 38.2* 35.9*  PLT 156  --  165 173 169    COAGS: Recent Labs    02/28/23 0037 11/19/23 0612  INR 0.9 1.0    BMP: Recent Labs    11/17/23 1225 11/17/23 1720 11/18/23 0556 11/19/23 0612 11/20/23 0613  NA 127* 129* 132* 133* 139  K 5.4* 4.8 5.2* 4.0 4.1  CL 95* 101 102 102 103  CO2 21*  --  20* 19* 24  GLUCOSE 92 88 116* 101* 107*  BUN 54* 47* 62* 59* 43*  CALCIUM 8.8*  --  8.2* 8.1* 8.4*  CREATININE 6.31* 7.10* 5.98* 5.01* 3.24*  GFRNONAA 10*  --  10* 13* 22*    LIVER FUNCTION TESTS: Recent Labs    02/28/23 0037 11/17/23 1225  BILITOT 0.9 0.9  AST 22 11*  ALT 21 12  ALKPHOS 43 50  PROT 6.8 6.6  ALBUMIN 4.1 3.9    TUMOR MARKERS:  No results for input(s): "AFPTM", "CEA", "CA199", "CHROMGRNA" in the last 8760 hours.  Assessment and Plan: 58 y.o. male with history of nephrolithiasis and recent admission to [facility] for abdominal pain with associated obstructive uropathy/AKI, left hydronephrosis, left ureteral stone and right staghorn calculus.  He is status post left ureteral stent placement on 4/20 and right percutaneous nephrostomy on 11/19/2023.  He presents now for conversion of right nephrostomy to nephroureteral catheter prior to nephrolithotomy in June 2025.  Risks and benefits of procedure was discussed with the patient/spouse including, but not limited to, infection, bleeding, significant bleeding causing loss or decrease in renal function or damage to adjacent structures.   All of the  patient's questions were answered, patient is agreeable to proceed.  Consent signed and in chart.      Thank you for allowing our service to participate in Jayion R Edler 's care.  Electronically Signed: D. Honore Lux, PA-C   12/05/2023, 5:07 PM      I spent a total of    25 Minutes in face to face in clinical consultation, greater than 50% of which was counseling/coordinating care for right nephrostogram with conversion of nephrostomy to nephroureteral catheter

## 2023-12-06 ENCOUNTER — Encounter (HOSPITAL_COMMUNITY): Payer: Self-pay

## 2023-12-06 ENCOUNTER — Ambulatory Visit (HOSPITAL_COMMUNITY)
Admission: RE | Admit: 2023-12-06 | Discharge: 2023-12-06 | Disposition: A | Discharge status: Home / Self Care | Source: Ambulatory Visit | Attending: Urology | Admitting: Urology

## 2023-12-06 ENCOUNTER — Ambulatory Visit (HOSPITAL_COMMUNITY)
Admission: RE | Admit: 2023-12-06 | Discharge: 2023-12-06 | Disposition: A | Discharge status: Home / Self Care | Source: Ambulatory Visit | Attending: Urology

## 2023-12-06 ENCOUNTER — Emergency Department (HOSPITAL_COMMUNITY)
Admission: EM | Admit: 2023-12-06 | Discharge: 2023-12-07 | Disposition: A | Discharge status: Home / Self Care | Attending: Emergency Medicine | Admitting: Emergency Medicine

## 2023-12-06 ENCOUNTER — Other Ambulatory Visit: Payer: Self-pay

## 2023-12-06 DIAGNOSIS — N200 Calculus of kidney: Secondary | ICD-10-CM | POA: Insufficient documentation

## 2023-12-06 DIAGNOSIS — R55 Syncope and collapse: Secondary | ICD-10-CM | POA: Diagnosis present

## 2023-12-06 DIAGNOSIS — I951 Orthostatic hypotension: Secondary | ICD-10-CM

## 2023-12-06 DIAGNOSIS — R109 Unspecified abdominal pain: Secondary | ICD-10-CM | POA: Diagnosis not present

## 2023-12-06 HISTORY — PX: IR CONVERT RIGHT NEPHROSTOMY TO NEPHROURETERAL CATH: IMG6068

## 2023-12-06 LAB — COMPREHENSIVE METABOLIC PANEL WITH GFR
ALT: 18 U/L (ref 0–44)
AST: 19 U/L (ref 15–41)
Albumin: 4 g/dL (ref 3.5–5.0)
Alkaline Phosphatase: 41 U/L (ref 38–126)
Anion gap: 11 (ref 5–15)
BUN: 23 mg/dL — ABNORMAL HIGH (ref 6–20)
CO2: 24 mmol/L (ref 22–32)
Calcium: 9.3 mg/dL (ref 8.9–10.3)
Chloride: 102 mmol/L (ref 98–111)
Creatinine, Ser: 1.35 mg/dL — ABNORMAL HIGH (ref 0.61–1.24)
GFR, Estimated: >60 mL/min (ref >60)
Glucose, Bld: 101 mg/dL — ABNORMAL HIGH (ref 70–99)
Potassium: 3.8 mmol/L (ref 3.5–5.1)
Sodium: 137 mmol/L (ref 135–145)
Total Bilirubin: 0.7 mg/dL (ref 0.0–1.2)
Total Protein: 7.2 g/dL (ref 6.5–8.1)

## 2023-12-06 LAB — CBC WITH DIFFERENTIAL/PLATELET
Abs Immature Granulocytes: 0.01 10*3/uL (ref 0.00–0.07)
Abs Immature Granulocytes: 0.02 10*3/uL (ref 0.00–0.07)
Basophils Absolute: 0 10*3/uL (ref 0.0–0.1)
Basophils Absolute: 0.1 10*3/uL (ref 0.0–0.1)
Basophils Relative: 1 %
Basophils Relative: 1 %
Eosinophils Absolute: 0.1 10*3/uL (ref 0.0–0.5)
Eosinophils Absolute: 0.1 10*3/uL (ref 0.0–0.5)
Eosinophils Relative: 2 %
Eosinophils Relative: 1 %
HCT: 39.2 % (ref 39.0–52.0)
HCT: 40.3 % (ref 39.0–52.0)
Hemoglobin: 13.5 g/dL (ref 13.0–17.0)
Hemoglobin: 13.3 g/dL (ref 13.0–17.0)
Immature Granulocytes: 0 %
Immature Granulocytes: 0 %
Lymphocytes Relative: 30 %
Lymphocytes Relative: 15 %
Lymphs Abs: 1.3 10*3/uL (ref 0.7–4.0)
Lymphs Abs: 1.1 10*3/uL (ref 0.7–4.0)
MCH: 30.7 pg (ref 26.0–34.0)
MCH: 30.2 pg (ref 26.0–34.0)
MCHC: 34.4 g/dL (ref 30.0–36.0)
MCHC: 33 g/dL (ref 30.0–36.0)
MCV: 89.1 fL (ref 80.0–100.0)
MCV: 91.4 fL (ref 80.0–100.0)
Monocytes Absolute: 0.3 10*3/uL (ref 0.1–1.0)
Monocytes Absolute: 0.4 10*3/uL (ref 0.1–1.0)
Monocytes Relative: 6 %
Monocytes Relative: 5 %
Neutro Abs: 2.6 10*3/uL (ref 1.7–7.7)
Neutro Abs: 5.9 10*3/uL (ref 1.7–7.7)
Neutrophils Relative %: 61 %
Neutrophils Relative %: 78 %
Platelets: 153 10*3/uL (ref 150–400)
Platelets: 158 10*3/uL (ref 150–400)
RBC: 4.4 MIL/uL (ref 4.22–5.81)
RBC: 4.41 MIL/uL (ref 4.22–5.81)
RDW: 11.8 % (ref 11.5–15.5)
RDW: 11.8 % (ref 11.5–15.5)
WBC: 4.2 10*3/uL (ref 4.0–10.5)
WBC: 7.6 10*3/uL (ref 4.0–10.5)
nRBC: 0 % (ref 0.0–0.2)
nRBC: 0 % (ref 0.0–0.2)

## 2023-12-06 LAB — PROTIME-INR
INR: 1 (ref 0.8–1.2)
Prothrombin Time: 13.8 s (ref 11.4–15.2)

## 2023-12-06 LAB — BASIC METABOLIC PANEL WITH GFR
Anion gap: 9 (ref 5–15)
BUN: 21 mg/dL — ABNORMAL HIGH (ref 6–20)
CO2: 25 mmol/L (ref 22–32)
Calcium: 9.4 mg/dL (ref 8.9–10.3)
Chloride: 103 mmol/L (ref 98–111)
Creatinine, Ser: 1.25 mg/dL — ABNORMAL HIGH (ref 0.61–1.24)
GFR, Estimated: >60 mL/min (ref >60)
Glucose, Bld: 86 mg/dL (ref 70–99)
Potassium: 4.1 mmol/L (ref 3.5–5.1)
Sodium: 137 mmol/L (ref 135–145)

## 2023-12-06 LAB — CBG MONITORING, ED: Glucose-Capillary: 99 mg/dL (ref 70–99)

## 2023-12-06 MED ORDER — MIDAZOLAM HCL 2 MG/2ML IJ SOLN
INTRAMUSCULAR | Status: AC
  Filled 2023-12-06: qty 2

## 2023-12-06 MED ORDER — FENTANYL CITRATE (PF) 100 MCG/2ML IJ SOLN
INTRAMUSCULAR | Status: AC
  Filled 2023-12-06: qty 2

## 2023-12-06 MED ORDER — HYDROCODONE-ACETAMINOPHEN 5-325 MG PO TABS: 1.0000 | ORAL_TABLET | ORAL | Status: DC | PRN

## 2023-12-06 MED ORDER — SODIUM CHLORIDE 0.9% FLUSH: 5.0000 mL | Freq: Three times a day (TID) | INTRAVENOUS | Status: DC

## 2023-12-06 MED ORDER — LEVOFLOXACIN IN D5W 500 MG/100ML IV SOLN
500.0000 mg | Freq: Once | INTRAVENOUS | Status: AC
Start: 2023-12-06 — End: 2023-12-06
  Administered 2023-12-06: 500 mg via INTRAVENOUS
  Filled 2023-12-06: qty 100

## 2023-12-06 MED ORDER — SODIUM CHLORIDE 0.9 % IV SOLN: INTRAVENOUS | Status: DC

## 2023-12-06 MED ORDER — LIDOCAINE HCL 1 % IJ SOLN
20.0000 mL | Freq: Once | INTRAMUSCULAR | Status: AC
  Administered 2023-12-06: 5 mL via INTRADERMAL

## 2023-12-06 MED ORDER — MIDAZOLAM HCL 2 MG/2ML IJ SOLN
INTRAMUSCULAR | Status: AC | PRN
  Administered 2023-12-06 (×2): 1 mg via INTRAVENOUS

## 2023-12-06 MED ORDER — FENTANYL CITRATE (PF) 100 MCG/2ML IJ SOLN
INTRAMUSCULAR | Status: AC | PRN
Start: 2023-12-06 — End: 2023-12-06
  Administered 2023-12-06 (×2): 25 ug via INTRAVENOUS
  Administered 2023-12-06: 50 ug via INTRAVENOUS

## 2023-12-06 MED ORDER — LIDOCAINE HCL 1 % IJ SOLN
INTRAMUSCULAR | Status: AC
  Filled 2023-12-06: qty 20

## 2023-12-06 MED ORDER — IOHEXOL 300 MG/ML  SOLN
50.0000 mL | Freq: Once | INTRAMUSCULAR | Status: AC | PRN
  Administered 2023-12-06: 20 mL

## 2023-12-06 NOTE — Procedures (Signed)
 Interventional Radiology Procedure:   Indications: Needs right nephroureteral catheter for percutaneous nephrolithotomy procedure  Procedure: Converted right nephrostomy to nephroureteral catheter  Findings: Staghorn calculus in renal pelvis.  10 Fr, 22 cm nephroureteral catheter placed, tip in bladder  Complications: None     EBL: Minimal  Plan: Plan to cap catheter prior to discharge.  Will send patient home with bag in case he needs to uncap.  Recommend flushing cathter once a day.    Dustin Todd R. Julietta Ogren, MD  Pager: [phone]

## 2023-12-06 NOTE — Patient Instructions (Signed)
 SURGICAL WAITING ROOM VISITATION  Patients having surgery or a procedure may have no more than 2 support people in the waiting area - these visitors may rotate.    Children under the age of 21 must have an adult with them who is not the patient.  Due to an increase in RSV and influenza rates and associated hospitalizations, children ages 64 and under may not visit patients in [facility] hospitals.  Visitors with respiratory illnesses are discouraged from visiting and should remain at home.  If the patient needs to stay at the hospital during part of their recovery, the visitor guidelines for inpatient rooms apply. Pre-op nurse will coordinate an appropriate time for 1 support person to accompany patient in pre-op.  This support person may not rotate.    Please refer to the [facility] website for the visitor guidelines for Inpatients (after your surgery is over and you are in a regular room).       Your procedure is scheduled on: 12-13-23   Report to [facility] Main Entrance    Report to admitting at       10:30  AM   Call this number if you have problems the morning of surgery [phone]   Do not eat food :After Midnight.   After Midnight you may have the following liquids until _0645_____ AM/  DAY OF SURGERY  then nothing by mouth  Water                                                     Sports drinks like Gatorade (NO RED)                            If you have questions, please contact your surgeon's office.   FOLLOW ANY ADDITIONAL PRE OP INSTRUCTIONS YOU RECEIVED FROM YOUR SURGEON'S OFFICE!!!     Oral Hygiene is also important to reduce your risk of infection.                                    Remember - BRUSH YOUR TEETH THE MORNING OF SURGERY WITH YOUR REGULAR TOOTHPASTE  DENTURES WILL BE REMOVED PRIOR TO SURGERY PLEASE DO NOT APPLY "Poly grip" OR ADHESIVES!!!   Do NOT smoke after Midnight   Stop all vitamins and herbal supplements 7 days before  surgery.   Take these medicines the morning of surgery with A SIP OF WATER : Tamsulosin , zyrtec  if needed    Bring CPAP mask and tubing day of surgery.                              You may not have any metal on your body including hair pins, jewelry, and body piercing             Do not wear , lotions, powders, /cologne, or deodorant  .               Men may shave face and neck.   Do not bring valuables to the hospital.  IS NOT             RESPONSIBLE   FOR VALUABLES.   Contacts, glasses,  dentures or bridgework may not be worn into surgery.   Bring small overnight bag day of surgery.   DO NOT BRING YOUR HOME MEDICATIONS TO THE HOSPITAL. PHARMACY WILL DISPENSE MEDICATIONS LISTED ON YOUR MEDICATION LIST TO YOU DURING YOUR ADMISSION IN THE HOSPITAL!    Patients discharged on the day of surgery will not be allowed to drive home.  Someone NEEDS to stay with you for the first 24 hours after anesthesia.   Special Instructions: Bring a copy of your healthcare power of attorney and living will documents the day of surgery if you haven't scanned them before.              Please read over the following fact sheets you were given: IF YOU HAVE QUESTIONS ABOUT YOUR PRE-OP INSTRUCTIONS PLEASE CALL [phone]    If you test positive for Covid or have been in contact with anyone that has tested positive in the last 10 days please notify you surgeon.    [facility] - Preparing for Surgery Before surgery, you can play an important role.  Because skin is not sterile, your skin needs to be as free of germs as possible.  You can reduce the number of germs on your skin by washing with CHG (chlorahexidine gluconate) soap before surgery.  CHG is an antiseptic cleaner which kills germs and bonds with the skin to continue killing germs even after washing. Please DO NOT use if you have an allergy to CHG or antibacterial soaps.  If your skin becomes reddened/irritated stop using the CHG and  inform your nurse when you arrive at Short Stay. Do not shave (including legs and underarms) for at least 48 hours prior to the first CHG shower.  You may shave your face/neck. Please follow these instructions carefully:  1.  Shower with CHG Soap the night before surgery and the  morning of Surgery.  2.  If you choose to wash your hair, wash your hair first as usual with your  normal  shampoo.  3.  After you shampoo, rinse your hair and body thoroughly to remove the  shampoo.                           4.  Use CHG as you would any other liquid soap.  You can apply chg directly  to the skin and wash                       Gently with a scrungie or clean washcloth.  5.  Apply the CHG Soap to your body ONLY FROM THE NECK DOWN.   Do not use on face/ open                           Wound or open sores. Avoid contact with eyes, ears mouth and genitals (private parts).                       Wash face,  Genitals (private parts) with your normal soap.             6.  Wash thoroughly, paying special attention to the area where your surgery  will be performed.  7.  Thoroughly rinse your body with warm water  from the neck down.  8.  DO NOT shower/wash with your normal soap after using and rinsing off  the CHG Soap.  9.  Pat yourself dry with a clean towel.            10.  Wear clean pajamas.            11.  Place clean sheets on your bed the night of your first shower and do not  sleep with pets. Day of Surgery : Do not apply any lotions/deodorants the morning of surgery.  Please wear clean clothes to the hospital/surgery center.  FAILURE TO FOLLOW THESE INSTRUCTIONS MAY RESULT IN THE CANCELLATION OF YOUR SURGERY PATIENT SIGNATURE_________________________________  NURSE SIGNATURE__________________________________  ________________________________________________________________________

## 2023-12-06 NOTE — ED Triage Notes (Addendum)
 Pt had nephrostomy converted to nephroureteral catheter today for kidney stones, had syncopal episode lasting 30 seconds after eating dinner today at 1800. Pt did not fall or hit head. Denies dizziness

## 2023-12-06 NOTE — Discharge Instructions (Addendum)
 Please call Interventional Radiology clinic [phone] with any questions or concerns.  You may change your dressing as directed by your provider.  Keep site dry, if you shower please cover as directed.  Do not submerge in water .    Flush drain as directed by PA.  5cc sterile NS daily.  Use drainage bag if you have discomfort or difficulty urinating.    Follow up with Dr Freddi Jaeger as directed.    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 Your health care provider may also give you more specific instructions. If you have problems or questions, contact your health careprovider. What can I expect after the procedure? After the procedure, it is common to have: Some soreness where the nephrostomy tube was inserted (tube insertion site). Blood-tinged drainage from the Nephroureteral catheter for the first 24 hours. Follow these instructions at home: Activity  Do not lift anything that is heavier than 10 lb (4.5 kg), or the limit that you are told, until your health care provider says that it is safe. Return to your normal activities as told by your health care provider. Ask your health care provider what activities are safe for you. Avoid activities that may cause the nephrostomy tubing to bend. Do not take baths, swim, or use a hot tub until your health care provider approves. Ask your health care provider if you can take showers. Cover the nephrostomy tube bandage (dressing) with a watertight covering when you take a shower. If you were given a sedative during the procedure, it can affect you for several hours. Do not drive or operate machinery until your health care provider says that it is safe.   Care of the tube insertion site  Follow instructions from your health care provider about how to take care of your tube insertion site. Make sure you: Wash your hands with soap and water  for at  least 20 seconds before you change your dressing. If soap and water  are not available, use hand sanitizer. Change your dressing as told by your health care provider. Be careful not to pull on the tube while removing the dressing. When you change the dressing, wash the skin around the tube, rinse well, and pat the skin dry. Check the tube insertion area every day for signs of infection. Check for: Redness, swelling, or pain. Fluid or blood. Warmth. Pus or a bad smell.   Care of the Nephroureteral catheter  and drainage bag Always keep the tubing, the leg bag, or the bedside drainage bags below the level of the kidney so that your urine drains freely. When connecting your nephrostomy tube to a drainage bag, make sure that there are no kinks in the tubing and that your urine is draining freely. You may want to use an elastic bandage to wrap any exposed tubing that goes from the nephrostomy tube to any of the connecting tubes. At night, you may want to connect your nephrostomy tube or the leg bag to a larger bedside drainage bag. Follow instructions from your health care provider about how to empty or change the drainage bag. Empty the drainage bag when it becomes ? full. Replace the drainage bag and any extension tubing that is connected to your nephrostomy tube every 7 days or as told by your health care provider. Your health care provider will explain how to change the drainage bag and extension tubing. General instructions Take over-the-counter and  prescription medicines only as told by your health care provider. Keep all follow-up visits as told by your health care provider. This is important. The nephrostomy tube will need to be changed every 8-12 weeks. Contact a health care provider if: You have problems with any of the valves or tubing. You have persistent pain or soreness in your back. You have redness, swelling, or pain around your tube insertion site. You have fluid or blood coming  from your tube insertion site. Your tube insertion site feels warm to the touch. You have pus or a bad smell coming from your tube insertion site. You have increased urine output or you feel burning when urinating. Get help right away if: You have pain in your abdomen during the first week. You have chest pain or have trouble breathing. You have a new appearance of blood in your urine. You have a fever or chills. You have back pain that is not relieved by your medicine. You have decreased urine output. Your nephrostomy tube comes out. Summary After the procedure, it is common to have some soreness where the nephrostomy tube was inserted (tube insertion site). Follow instructions from your health care provider about how to take care of your tube insertion site, nephrostomy tube, and drainage bag. Keep all follow-up visits for care and for changing the tube. This information is not intended to replace advice given to you by your health care provider. Make sure you discuss any questions you have with your healthcare provider. Document Revised: 08/11/2019 Document Reviewed: 08/11/2019 [employer] Patient Education  2022 [employer]   Moderate Conscious Sedation, Adult, Care After This sheet gives you information about how to care for yourself after your procedure. Your health care provider may also give you more specific instructions. If you have problems or questions, contact your health care provider. What can I expect after the procedure? After the procedure, it is common to have: Sleepiness for several hours. Impaired judgment for several hours. Difficulty with balance. Vomiting if you eat too soon. Follow these instructions at home: For the time period you were told by your health care provider: Rest. Do not participate in activities where you could fall or become injured. Do not drive or use machinery. Do not drink alcohol. Do not take sleeping pills or medicines that cause  drowsiness. Do not make important decisions or sign legal documents. Do not take care of children on your own.      Eating and drinking Follow the diet recommended by your health care provider. Drink enough fluid to keep your urine pale yellow. If you vomit: Drink water , juice, or soup when you can drink without vomiting. Make sure you have little or no nausea before eating solid foods.   General instructions Take over-the-counter and prescription medicines only as told by your health care provider. Have a responsible adult stay with you for the time you are told. It is important to have someone help care for you until you are awake and alert. Do not smoke. Keep all follow-up visits as told by your health care provider. This is important. Contact a health care provider if: You are still sleepy or having trouble with balance after 24 hours. You feel light-headed. You keep feeling nauseous or you keep vomiting. You develop a rash. You have a fever. You have redness or swelling around the IV site. Get help right away if: You have trouble breathing. You have new-onset confusion at home. Summary After the procedure, it is common to  feel sleepy, have impaired judgment, or feel nauseous if you eat too soon. Rest after you get home. Know the things you should not do after the procedure. Follow the diet recommended by your health care provider and drink enough fluid to keep your urine pale yellow. Get help right away if you have trouble breathing or new-onset confusion at home. This information is not intended to replace advice given to you by your health care provider. Make sure you discuss any questions you have with your health care provider. Document Revised: 11/13/2019 Document Reviewed: 06/11/2019 [employer] Patient Education  2021 [employer].

## 2023-12-07 ENCOUNTER — Emergency Department (HOSPITAL_COMMUNITY)

## 2023-12-07 MED ORDER — SODIUM CHLORIDE 0.9 % IV BOLUS
1000.0000 mL | Freq: Once | INTRAVENOUS | Status: AC
  Administered 2023-12-07: 1000 mL via INTRAVENOUS

## 2023-12-07 MED ORDER — SULFAMETHOXAZOLE-TRIMETHOPRIM 800-160 MG PO TABS: 1.0000 | ORAL_TABLET | Freq: Two times a day (BID) | ORAL | 0 refills | Status: AC

## 2023-12-07 NOTE — ED Provider Notes (Signed)
 [place] EMERGENCY DEPARTMENT AT [facility] Provider Note   CSN: [id number] Arrival date & time: 12/06/23  1948     History  Chief Complaint  Patient presents with   Loss of Consciousness    Rayfield R Forester is a 58 y.o. male.  The history is provided by the patient and the spouse.  Loss of Consciousness Episode history:  Single Most recent episode:  Today Timing:  Rare Progression:  Resolved Chronicity:  New Context comment:  S/p nephrostomy conversion to nephroureteral catheter in OR under sedation.  came home and passed out Witnessed: yes   Relieved by:  Nothing Worsened by:  Nothing Ineffective treatments:  None tried Associated symptoms: no fever, no recent fall, no vomiting and no weakness   Risk factors: no coronary artery disease   Patient with kidney stones who had     Past Medical History:  Diagnosis Date   History of kidney stones      Home Medications Prior to Admission medications   Medication Sig Start Date End Date Taking? Authorizing Provider  sulfamethoxazole-trimethoprim (BACTRIM DS) 800-160 MG tablet Take 1 tablet by mouth 2 (two) times daily for 7 days. 12/07/23 12/14/23 Yes Lashanda Storlie, MD  cetirizine  (ZYRTEC ) 10 MG tablet Take 1 tablet (10 mg total) by mouth daily. Patient taking differently: Take 10 mg by mouth daily as needed for allergies. 04/18/23   Soldatova, Liuba, MD      Allergies    Gluten meal and Penicillins    Review of Systems   Review of Systems  Constitutional:  Negative for fever.  Respiratory:  Negative for wheezing and stridor.   Cardiovascular:  Positive for syncope.  Gastrointestinal:  Negative for vomiting.  Neurological:  Positive for syncope. Negative for weakness.    Physical Exam Updated Vital Signs BP 120/76 (BP Location: Right Arm)   Pulse (!) 56   Temp 98.7 F (37.1 C) (Oral)   Resp 14   Ht 5\' 8"  (1.727 m)   Wt 76.2 kg   SpO2 100%   BMI 25.54 kg/m  Physical Exam Vitals and nursing  note reviewed.  Constitutional:      General: He is not in acute distress.    Appearance: He is well-developed. He is not diaphoretic.  HENT:     Head: Normocephalic and atraumatic.     Nose: Nose normal.  Eyes:     Conjunctiva/sclera: Conjunctivae normal.     Pupils: Pupils are equal, round, and reactive to light.  Cardiovascular:     Rate and Rhythm: Normal rate and regular rhythm.     Pulses: Normal pulses.     Heart sounds: Normal heart sounds.  Pulmonary:     Effort: Pulmonary effort is normal.     Breath sounds: Normal breath sounds. No wheezing or rales.  Abdominal:     General: Bowel sounds are normal.     Palpations: Abdomen is soft.     Tenderness: There is no abdominal tenderness. There is no guarding or rebound.  Musculoskeletal:        General: Normal range of motion.     Cervical back: Normal range of motion and neck supple.  Skin:    General: Skin is warm and dry.     Capillary Refill: Capillary refill takes less than 2 seconds.  Neurological:     General: No focal deficit present.     Mental Status: He is alert and oriented to person, place, and time.     Deep  Tendon Reflexes: Reflexes normal.  Psychiatric:        Mood and Affect: Mood normal.        Behavior: Behavior normal.     ED Results / Procedures / Treatments   Labs (all labs ordered are listed, but only abnormal results are displayed) Results for orders placed or performed during the hospital encounter of 12/06/23  CBC WITH DIFFERENTIAL   Collection Time: 12/06/23  8:05 PM  Result Value Ref Range   WBC 7.6 4.0 - 10.5 K/uL   RBC 4.41 4.22 - 5.81 MIL/uL   Hemoglobin 13.3 13.0 - 17.0 g/dL   HCT 16.1 09.6 - 04.5 %   MCV 91.4 80.0 - 100.0 fL   MCH 30.2 26.0 - 34.0 pg   MCHC 33.0 30.0 - 36.0 g/dL   RDW 40.9 81.1 - 91.4 %   Platelets 158 150 - 400 K/uL   nRBC 0.0 0.0 - 0.2 %   Neutrophils Relative % 78 %   Neutro Abs 5.9 1.7 - 7.7 K/uL   Lymphocytes Relative 15 %   Lymphs Abs 1.1 0.7 - 4.0  K/uL   Monocytes Relative 5 %   Monocytes Absolute 0.4 0.1 - 1.0 K/uL   Eosinophils Relative 1 %   Eosinophils Absolute 0.1 0.0 - 0.5 K/uL   Basophils Relative 1 %   Basophils Absolute 0.1 0.0 - 0.1 K/uL   Immature Granulocytes 0 %   Abs Immature Granulocytes 0.02 0.00 - 0.07 K/uL  Comprehensive metabolic panel   Collection Time: 12/06/23  8:05 PM  Result Value Ref Range   Sodium 137 135 - 145 mmol/L   Potassium 3.8 3.5 - 5.1 mmol/L   Chloride 102 98 - 111 mmol/L   CO2 24 22 - 32 mmol/L   Glucose, Bld 101 (H) 70 - 99 mg/dL   BUN 23 (H) 6 - 20 mg/dL   Creatinine, Ser 7.82 (H) 0.61 - 1.24 mg/dL   Calcium 9.3 8.9 - 95.6 mg/dL   Total Protein 7.2 6.5 - 8.1 g/dL   Albumin 4.0 3.5 - 5.0 g/dL   AST 19 15 - 41 U/L   ALT 18 0 - 44 U/L   Alkaline Phosphatase 41 38 - 126 U/L   Total Bilirubin 0.7 0.0 - 1.2 mg/dL   GFR, Estimated >21 >30 mL/min   Anion gap 11 5 - 15  CBG monitoring, ED   Collection Time: 12/06/23  8:09 PM  Result Value Ref Range   Glucose-Capillary 99 70 - 99 mg/dL   CT Renal Stone Study Result Date: 12/07/2023 CLINICAL DATA:  Abdominal pain/flank pain. EXAM: CT ABDOMEN AND PELVIS WITHOUT CONTRAST TECHNIQUE: Multidetector CT imaging of the abdomen and pelvis was performed following the standard protocol without IV contrast. RADIATION DOSE REDUCTION: This exam was performed according to the departmental dose-optimization program which includes automated exposure control, adjustment of the mA and/or kV according to patient size and/or use of iterative reconstruction technique. COMPARISON:  11/17/2023 FINDINGS: Lower chest: No acute abnormality. Hepatobiliary: No focal liver abnormality is seen. No gallstones, gallbladder wall thickening, or biliary dilatation. Pancreas: Unremarkable. No pancreatic ductal dilatation or surrounding inflammatory changes. Spleen: Normal in size without focal abnormality. Adrenals/Urinary Tract: Normal adrenal glands. Bilateral nephroureteral stents  are identified. There is a right-sided percutaneous nephrostomy tube. There is been interval decompression of previous bilateral hydronephrosis. Upper pole left kidney cyst measures 1 cm, image 27/2. No follow-up imaging recommended. Multiple small left renal calculi are identified within the inferior pole. The largest  measures 5 mm, image 54/8. Gas noted within the upper pole left renal collecting system. Two stones are again seen within the distal left ureter which measure up to 6 mm. The largest measures 6 mm, image 72/2. Right-sided staghorn calculus is again seen partially for fragmented staghorn calculus is again identified with stone fragments involving the upper and lower pole collecting system as well as the renal pelvis extending up to the right UPJ as noted previously. Gas is noted within the upper pole collecting system of the right kidney. No right ureteral calculi. Urinary bladder appears decompressed. Small volume of gas noted within the bladder. No bladder calculi or other focal abnormality. Stomach/Bowel: Stomach appears normal. The appendix is visualized and appears normal. No pathologic dilatation of the large or small bowel loops. Vascular/Lymphatic: Aortic atherosclerosis. No aneurysm. No signs of abdominopelvic adenopathy. Reproductive: Prostate is unremarkable. Other: No free fluid or fluid collections. No signs of pneumoperitoneum. Musculoskeletal: No acute or significant osseous findings. Bilateral L2 pars defects. 5 mm anterolisthesis of L2 on L3. L5-S1 degenerative disc disease. IMPRESSION: 1. Interval decompression of previous bilateral hydronephrosis status post bilateral nephroureteral stent placement. Right-sided percutaneous nephrostomy tube is also noted. 2. Persistent, partially fragmented right staghorn calculus staghorn calculus extending into the right renal pelvis of to the right UPJ. 3. Multiple small left renal calculi with two persistent stones within the distal left ureter.  4. There is gas identified within bilateral renal collecting systems and urinary bladder which is nonspecific but likely related to instrumentation. Underlying urinary tract infection cannot be excluded. 5.  Aortic Atherosclerosis (ICD10-I70.0). Electronically Signed   By: Kimberley Penman M.D.   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 Patient needs the right nephrostomy tube converted to a nephroureteral catheter prior to nephrolithotomy procedure. EXAM: CONVERSION OF RIGHT NEPHROSTOMY TUBE TO A NEPHROURETERAL CATHETER COMPARISON:  11/19/2023 MEDICATIONS: Levaquin 500 mg; The antibiotic was administered in an appropriate time frame prior to skin puncture. ANESTHESIA/SEDATION: Moderate (conscious) sedation was employed during this procedure. A total of Versed  2 mg and Fentanyl  100 mcg was administered intravenously by the radiology nurse. Total intra-service moderate Sedation Time: 31 minutes. The patient's level of consciousness and vital signs were monitored continuously by radiology nursing throughout the procedure under my direct supervision. CONTRAST:  20 mL Omnipaque  300-administered into the collecting system(s) FLUOROSCOPY: Radiation Exposure Index (as provided by the fluoroscopic device): 6.9 mGy Kerma COMPLICATIONS: None immediate. PROCEDURE: Informed written consent was obtained from the patient after a thorough discussion of the procedural risks, benefits and alternatives. All questions were addressed. Maximal Sterile Barrier Technique was utilized including caps, mask, sterile gowns, sterile gloves, sterile drape, hand hygiene and skin antiseptic. A timeout was performed prior to the initiation of the procedure. Patient was placed prone and the right nephrostomy tube and surrounding skin was prepped and draped in sterile fashion. Contrast was injected through the nephrostomy tube.  Nephrostomy tube was cut. A 5 French Kumpe catheter was advanced through the nephrostomy tube and a Bentson wire was advanced through the 5 French catheter in order to get the wire within the renal collecting system. Both the 5 French catheter and the old nephrostomy tube were removed over the wire. Five French catheter was then advanced over the Bentson wire and was advanced into the renal collecting system using a stiff Glidewire. Eventually, the stiff Glidewire was advanced down the right ureter into the bladder. Catheter  was advanced over the wire into the bladder. A 10 French, 22 cm nephroureteral catheter was advanced over the stiff Glidewire. The catheter would not advance beyond the distal right ureter. Stiff Glidewire was exchanged for superstiff Amplatz wire. The catheter was advanced over this wire into the bladder. The distal aspect of the nephroureteral catheter was reconstituted in the bladder. The opaque marker was placed in the proximal right ureter. Contrast injection confirmed appropriate placement. Catheter was flushed with saline and attached to a gravity bag. Nephroureteral catheter was sutured to skin. Fluoroscopic images were taken and saved for this procedure. FINDINGS: Staghorn renal calculi throughout the right renal collecting system including the right renal pelvis and proximal right ureter. 10 French, 22 cm nephroureteral catheter is appropriately positioned with the tip in the bladder. IMPRESSION: 1. Successful conversion of the right nephrostomy tube to a right nephroureteral catheter. 2. Nephroureteral catheter was capped prior to patient being discharged home. Patient was also instructed to flush the catheter once a day. Patient was given a bag in case the catheter needs to be uncapped. Electronically Signed   By: Elene Griffes M.D.   On: 12/06/2023 17:42   IR NEPHROSTOMY PLACEMENT RIGHT Result Date: 11/19/2023 INDICATION: 58 year old male with history of right staghorn calculus.  EXAM: 1. ULTRASOUND GUIDANCE FOR PUNCTURE OF THE RIGHT RENAL COLLECTING SYSTEM 2. RIGHT PERCUTANEOUS NEPHROSTOMY TUBE PLACEMENT. COMPARISON:  11/17/2023 MEDICATIONS: Rocephin  2 gm IV; The antibiotic was administered in an appropriate time frame prior to skin puncture. ANESTHESIA/SEDATION: Moderate (conscious) sedation was employed during this procedure. A total of Versed  3 mg and Fentanyl  100 mcg was administered intravenously. Moderate Sedation Time: 19 minutes. The patient's level of consciousness and vital signs were monitored continuously by radiology nursing throughout the procedure under my direct supervision. CONTRAST:  Twenty mL Isovue 300 - administered into the renal collecting system FLUOROSCOPY TIME:  minutes  seconds. COMPLICATIONS: None immediate. PROCEDURE: The procedure, risks, benefits, and alternatives were explained to the patient. Questions regarding the procedure were encouraged and answered. The patient understands and consents to the procedure. A timeout was performed prior to the initiation of the procedure. The right flank region was prepped and draped in the usual sterile fashion and a sterile drape was applied covering the operative field. A sterile gown and sterile gloves were used for the procedure. Local anesthesia was provided with 1% Lidocaine  with epinephrine . Ultrasound was used to localize the right kidney. Under direct ultrasound guidance, a 20 gauge needle was advanced into the renal collecting system. An ultrasound image documentation was performed. Access within the collecting system was confirmed with the efflux of urine followed by limited contrast injection. Over a Nitrex wire, the tract was dilated with an Accustick stent. Next, under intermittent fluoroscpic guidance and over a short Amplatz wire, the track was dilated ultimately allowing placement of a 10-French percutaneous nephrostomy catheter which was advanced to the level of the renal pelvis where the coil was formed  and locked. Contrast was injected and several spot fluoroscopic images were obtained in various obliquities. The catheter was secured at the skin with a Prolene retention suture and stat lock device and connected to a gravity bag was placed. Dressings were applied. The patient tolerated procedure well without immediate postprocedural complication. FINDINGS: Ultrasound scanning demonstrates a moderate to severely dilated right collecting system. Under a combination of ultrasound and fluoroscopic guidance, a posterior inferior calix was targeted allowing placement of a 10-French percutaneous nephrostomy catheter with end coiled and locked within the renal  pelvis. Contrast injection confirmed appropriate positioning. IMPRESSION: Successful ultrasound and fluoroscopic guided placement of a right sided 10 French percutaneous nephrostomy. Creasie Doctor, MD [facility] Electronically Signed   By: Creasie Doctor M.D.   On: 11/19/2023 20:45   DG C-Arm 1-60 Min-No Report Result Date: 11/17/2023 Fluoroscopy was utilized by the requesting physician.  No radiographic interpretation.   CT ABDOMEN PELVIS WO CONTRAST Result Date: 11/17/2023 CLINICAL DATA:  Abdominal pain and distension for 2 weeks. Difficulty urinating for 2 days. EXAM: CT ABDOMEN AND PELVIS WITHOUT CONTRAST TECHNIQUE: Multidetector CT imaging of the abdomen and pelvis was performed following the standard protocol without IV contrast. RADIATION DOSE REDUCTION: This exam was performed according to the departmental dose-optimization program which includes automated exposure control, adjustment of the mA and/or kV according to patient size and/or use of iterative reconstruction technique. COMPARISON:  Abdominal ultrasound 03/11/2009 FINDINGS: Lower chest: Mild dependent atelectasis is present at the lung bases. The lungs are otherwise clear. The heart size is normal. No significant pleural or pericardial  effusion is present. Hepatobiliary: Previously described hepatic lesions are not evident on this noncontrast study. The liver is within normal limits. Common bile duct and gallbladder are normal. Pancreas: Unremarkable. No pancreatic ductal dilatation or surrounding inflammatory changes. Spleen: Normal in size without focal abnormality. The adrenal glands are normal bilaterally. Adrenals/Urinary Tract: A staghorn calculus is present in the right kidney with obstructing component extending to the right UPJ. The more distal ureter is within normal limits. Mild inflammatory changes surround the right kidney. A 7 mm nonobstructing stone is present the lower pole of the left kidney. Moderate left hydronephrosis present. The ureter is dilated. Two distal left ureteral stones are lodged just above the UVJ measuring 7 and 11 mm respectively. The urinary bladder is mostly collapsed. Stomach/Bowel: The stomach and duodenum are within normal limits. The small bowel is unremarkable. Terminal ileum is normal. The appendix is visualized and normal. The ascending and transverse colon are within normal limits. The descending and sigmoid colon are normal. Vascular/Lymphatic: Minimal atherosclerotic calcifications are present in the aorta and branch vessels. No aneurysm is present. No significant adenopathy is present. Reproductive: Status post hysterectomy. No adnexal masses. Other: No abdominal wall hernia or abnormality. No abdominopelvic ascites. Musculoskeletal: The vertebral body heights alignment are normal. Vacuum disc is present at L5-S1. No focal osseous lesions are present. Bony pelvis is normal. The hips are located and within normal limits. IMPRESSION: 1. Two distal left ureteral stones are lodged just above the UVJ measuring 7 and 11 mm result in moderate left-sided hydronephrosis and dilation of the ureter. 2. 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 3. 7 mm  nonobstructing stone in the lower pole of the left kidney. 4. Previously described hepatic lesions are not evident on this noncontrast study. Electronically Signed   By: Audree Leas M.D.   On: 11/17/2023 14:57    EKG EKG Interpretation Date/Time:  Friday Dec 06 2023 20:13:13 EDT Ventricular Rate:  62 PR Interval:  150 QRS Duration:  107 QT Interval:  416 QTC Calculation: 423 R Axis:   68  Text Interpretation: Sinus rhythm Confirmed by Blaze Nylund ([id number]) on 12/07/2023 6:50:27 AM  Radiology CT Renal Stone Study Result Date: 12/07/2023 CLINICAL DATA:  Abdominal pain/flank pain. EXAM: CT ABDOMEN AND PELVIS WITHOUT CONTRAST TECHNIQUE: Multidetector CT imaging of the abdomen and pelvis was performed following the standard protocol without IV contrast. RADIATION DOSE  REDUCTION: This exam was performed according to the departmental dose-optimization program which includes automated exposure control, adjustment of the mA and/or kV according to patient size and/or use of iterative reconstruction technique. COMPARISON:  11/17/2023 FINDINGS: Lower chest: No acute abnormality. Hepatobiliary: No focal liver abnormality is seen. No gallstones, gallbladder wall thickening, or biliary dilatation. Pancreas: Unremarkable. No pancreatic ductal dilatation or surrounding inflammatory changes. Spleen: Normal in size without focal abnormality. Adrenals/Urinary Tract: Normal adrenal glands. Bilateral nephroureteral stents are identified. There is a right-sided percutaneous nephrostomy tube. There is been interval decompression of previous bilateral hydronephrosis. Upper pole left kidney cyst measures 1 cm, image 27/2. No follow-up imaging recommended. Multiple small left renal calculi are identified within the inferior pole. The largest measures 5 mm, image 54/8. Gas noted within the upper pole left renal collecting system. Two stones are again seen within the distal left ureter which measure up to 6 mm. The  largest measures 6 mm, image 72/2. Right-sided staghorn calculus is again seen partially for fragmented staghorn calculus is again identified with stone fragments involving the upper and lower pole collecting system as well as the renal pelvis extending up to the right UPJ as noted previously. Gas is noted within the upper pole collecting system of the right kidney. No right ureteral calculi. Urinary bladder appears decompressed. Small volume of gas noted within the bladder. No bladder calculi or other focal abnormality. Stomach/Bowel: Stomach appears normal. The appendix is visualized and appears normal. No pathologic dilatation of the large or small bowel loops. Vascular/Lymphatic: Aortic atherosclerosis. No aneurysm. No signs of abdominopelvic adenopathy. Reproductive: Prostate is unremarkable. Other: No free fluid or fluid collections. No signs of pneumoperitoneum. Musculoskeletal: No acute or significant osseous findings. Bilateral L2 pars defects. 5 mm anterolisthesis of L2 on L3. L5-S1 degenerative disc disease. IMPRESSION: 1. Interval decompression of previous bilateral hydronephrosis status post bilateral nephroureteral stent placement. Right-sided percutaneous nephrostomy tube is also noted. 2. Persistent, partially fragmented right staghorn calculus staghorn calculus extending into the right renal pelvis of to the right UPJ. 3. Multiple small left renal calculi with two persistent stones within the distal left ureter. 4. There is gas identified within bilateral renal collecting systems and urinary bladder which is nonspecific but likely related to instrumentation. Underlying urinary tract infection cannot be excluded. 5.  Aortic Atherosclerosis (ICD10-I70.0). Electronically Signed   By: Kimberley Penman M.D.   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 Patient needs the right nephrostomy  tube converted to a nephroureteral catheter prior to nephrolithotomy procedure. EXAM: CONVERSION OF RIGHT NEPHROSTOMY TUBE TO A NEPHROURETERAL CATHETER COMPARISON:  11/19/2023 MEDICATIONS: Levaquin 500 mg; The antibiotic was administered in an appropriate time frame prior to skin puncture. ANESTHESIA/SEDATION: Moderate (conscious) sedation was employed during this procedure. A total of Versed  2 mg and Fentanyl  100 mcg was administered intravenously by the radiology nurse. Total intra-service moderate Sedation Time: 31 minutes. The patient's level of consciousness and vital signs were monitored continuously by radiology nursing throughout the procedure under my direct supervision. CONTRAST:  20 mL Omnipaque  300-administered into the collecting system(s) FLUOROSCOPY: Radiation Exposure Index (as provided by the fluoroscopic device): 6.9 mGy Kerma COMPLICATIONS: None immediate. PROCEDURE: Informed written consent was obtained from the patient after a thorough discussion of the procedural risks, benefits and alternatives. All questions were addressed. Maximal Sterile Barrier Technique was utilized including caps, mask, sterile gowns, sterile gloves, sterile drape, hand hygiene and skin  antiseptic. A timeout was performed prior to the initiation of the procedure. Patient was placed prone and the right nephrostomy tube and surrounding skin was prepped and draped in sterile fashion. Contrast was injected through the nephrostomy tube. Nephrostomy tube was cut. A 5 French Kumpe catheter was advanced through the nephrostomy tube and a Bentson wire was advanced through the 5 French catheter in order to get the wire within the renal collecting system. Both the 5 French catheter and the old nephrostomy tube were removed over the wire. Five French catheter was then advanced over the Bentson wire and was advanced into the renal collecting system using a stiff Glidewire. Eventually, the stiff Glidewire was advanced down the right  ureter into the bladder. Catheter was advanced over the wire into the bladder. A 10 French, 22 cm nephroureteral catheter was advanced over the stiff Glidewire. The catheter would not advance beyond the distal right ureter. Stiff Glidewire was exchanged for superstiff Amplatz wire. The catheter was advanced over this wire into the bladder. The distal aspect of the nephroureteral catheter was reconstituted in the bladder. The opaque marker was placed in the proximal right ureter. Contrast injection confirmed appropriate placement. Catheter was flushed with saline and attached to a gravity bag. Nephroureteral catheter was sutured to skin. Fluoroscopic images were taken and saved for this procedure. FINDINGS: Staghorn renal calculi throughout the right renal collecting system including the right renal pelvis and proximal right ureter. 10 French, 22 cm nephroureteral catheter is appropriately positioned with the tip in the bladder. IMPRESSION: 1. Successful conversion of the right nephrostomy tube to a right nephroureteral catheter. 2. Nephroureteral catheter was capped prior to patient being discharged home. Patient was also instructed to flush the catheter once a day. Patient was given a bag in case the catheter needs to be uncapped. Electronically Signed   By: Elene Griffes M.D.   On: 12/06/2023 17:42    Procedures Procedures    Medications Ordered in ED Medications  sodium chloride  0.9 % bolus 1,000 mL (0 mLs Intravenous Stopped 12/07/23 0656)    ED Course/ Medical Decision Making/ A&P                                 Medical Decision Making Patient with procedure on flomax  who went home and had syncopal event   Amount and/or Complexity of Data Reviewed Independent Historian: spouse    Details: See above  External Data Reviewed: notes.    Details: Previous notes reviewed  Labs: ordered.    Details: Urine is obscured by blood normal sodium 137, normal potassium 3.8, elevated creatinine 1.35.   Normal white count 7.6 normal hemoglobin 13.3, normal platelets  Radiology: ordered and independent interpretation performed.    Details: Stones in ureters  Discussion of management or test interpretation with external provider(s): 645 case d/w Dr. Estanislao Heimlich, cannot see OR notes.  Start antibiotics.  Hydratre if not done so and hold flomax .  Stable for discharge.  If new or worsening symptoms return to ED  Risk Prescription drug management. Risk Details: Mildly orthostatic hydrated in the ED.  Patient advised to stop flomax , start antibiotics and orally hydrate well at home.  Return for fevers, weakness, emesis, change in urination or any new or concerning symptoms.  Contact your surgeon on Monday am.      Final Clinical Impression(s) / ED Diagnoses Final diagnoses:  Syncope, unspecified syncope type  Orthostasis  No signs of systemic illness or infection. The patient is nontoxic-appearing on exam and vital signs are within normal limits.  I have reviewed the triage vital signs and the nursing notes. Pertinent labs & imaging results that were available during my care of the patient were reviewed by me and considered in my medical decision making (see chart for details). After history, exam, and medical workup I feel the patient has been appropriately medically screened and is safe for discharge home. Pertinent diagnoses were discussed with the patient. Patient was given return precautions.    Rx / DC Orders ED Discharge Orders          Ordered    sulfamethoxazole-trimethoprim (BACTRIM DS) 800-160 MG tablet  2 times daily        12/07/23 0652              Rondey Fallen, MD 12/07/23 [phone]

## 2023-12-09 ENCOUNTER — Other Ambulatory Visit: Payer: Self-pay

## 2023-12-09 ENCOUNTER — Encounter (HOSPITAL_COMMUNITY)
Admission: RE | Admit: 2023-12-09 | Discharge: 2023-12-09 | Disposition: A | Discharge status: Home / Self Care | Source: Ambulatory Visit | Attending: Urology | Admitting: Urology

## 2023-12-09 DIAGNOSIS — Z01818 Encounter for other preprocedural examination: Secondary | ICD-10-CM | POA: Insufficient documentation

## 2023-12-09 HISTORY — DX: Personal history of urinary calculi: Z87.442

## 2023-12-09 NOTE — Progress Notes (Signed)
 COVID Vaccine received:  []  No [x]  Yes Date of any COVID positive Test in last 90 days: no PCP - Does not have one Cardiologist - n/a  Chest x-ray -  EKG -   Stress Test -  ECHO -  Cardiac Cath -   Bowel Prep - [x]  No  []   Yes ______  Pacemaker / ICD device [x]  No []  Yes   Spinal Cord Stimulator:[x]  No []  Yes       History of Sleep Apnea? [x]  No []  Yes   CPAP used?- [x]  No []  Yes    Does the patient monitor blood sugar?          [x]  No []  Yes  []  N/A  Patient has: [x]  NO Hx DM   []  Pre-DM                 []  DM1  []   DM2 Does patient have a [employer] or Dexacom? []  No []  Yes   Fasting Blood Sugar Ranges-  Checks Blood Sugar _____ times a day  GLP1 agonist / usual dose - no GLP1 instructions:  SGLT-2 inhibitors / usual dose - no SGLT-2 instructions:   Blood Thinner / Instructions:no Aspirin Instructions:no  Comments:   Activity level: Patient is able  to climb a flight of stairs without difficulty; [x]  No CP  [x]  No SOB,    Patient can perform ADLs without assistance.   Anesthesia review:   Patient denies shortness of breath, fever, cough and chest pain at PAT appointment.  Patient verbalized understanding and agreement to the Pre-Surgical Instructions that were given to them at this PAT appointment. Patient was also educated of the need to review these PAT instructions again prior to his/her surgery.I reviewed the appropriate phone numbers to call if they have any and questions or concerns.

## 2023-12-13 ENCOUNTER — Other Ambulatory Visit: Payer: Self-pay

## 2023-12-13 ENCOUNTER — Encounter (HOSPITAL_COMMUNITY)
Admission: RE | Disposition: A | Discharge status: Home / Self Care | Payer: Self-pay | Source: Ambulatory Visit | Attending: Urology

## 2023-12-13 ENCOUNTER — Ambulatory Visit (HOSPITAL_COMMUNITY)

## 2023-12-13 ENCOUNTER — Ambulatory Visit (HOSPITAL_COMMUNITY): Admitting: Certified Registered"

## 2023-12-13 ENCOUNTER — Ambulatory Visit (HOSPITAL_COMMUNITY)
Admission: RE | Admit: 2023-12-13 | Discharge: 2023-12-13 | Disposition: A | Discharge status: Home / Self Care | Source: Ambulatory Visit | Attending: Urology | Admitting: Urology

## 2023-12-13 ENCOUNTER — Encounter (HOSPITAL_COMMUNITY): Payer: Self-pay | Admitting: Urology

## 2023-12-13 DIAGNOSIS — N132 Hydronephrosis with renal and ureteral calculous obstruction: Secondary | ICD-10-CM | POA: Insufficient documentation

## 2023-12-13 DIAGNOSIS — N201 Calculus of ureter: Secondary | ICD-10-CM

## 2023-12-13 DIAGNOSIS — F419 Anxiety disorder, unspecified: Secondary | ICD-10-CM | POA: Diagnosis not present

## 2023-12-13 DIAGNOSIS — N202 Calculus of kidney with calculus of ureter: Secondary | ICD-10-CM | POA: Diagnosis present

## 2023-12-13 HISTORY — PX: CYSTOSCOPY/URETEROSCOPY/HOLMIUM LASER/STENT PLACEMENT: SHX6546

## 2023-12-13 HISTORY — PX: CYSTOSCOPY W/ RETROGRADES: SHX1426

## 2023-12-13 SURGERY — CYSTOSCOPY/URETEROSCOPY/HOLMIUM LASER/STENT PLACEMENT
Anesthesia: General | Site: Ureter | Laterality: Left

## 2023-12-13 MED ORDER — 0.9 % SODIUM CHLORIDE (POUR BTL) OPTIME
TOPICAL | Status: DC | PRN
  Administered 2023-12-13: 1000 mL

## 2023-12-13 MED ORDER — ONDANSETRON HCL 4 MG/2ML IJ SOLN
INTRAMUSCULAR | Status: AC
  Filled 2023-12-13: qty 2

## 2023-12-13 MED ORDER — CHLORHEXIDINE GLUCONATE 0.12 % MT SOLN
15.0000 mL | Freq: Once | OROMUCOSAL | Status: AC
  Administered 2023-12-13: 15 mL via OROMUCOSAL

## 2023-12-13 MED ORDER — ORAL CARE MOUTH RINSE: 15.0000 mL | Freq: Once | OROMUCOSAL | Status: AC

## 2023-12-13 MED ORDER — OXYCODONE-ACETAMINOPHEN 5-325 MG PO TABS: 1.0000 | ORAL_TABLET | ORAL | 0 refills | Status: DC | PRN

## 2023-12-13 MED ORDER — OXYCODONE HCL 5 MG/5ML PO SOLN: 5.0000 mg | Freq: Once | ORAL | Status: DC | PRN

## 2023-12-13 MED ORDER — PROPOFOL 10 MG/ML IV BOLUS
INTRAVENOUS | Status: AC
  Filled 2023-12-13: qty 20

## 2023-12-13 MED ORDER — LIDOCAINE HCL (CARDIAC) PF 100 MG/5ML IV SOSY
PREFILLED_SYRINGE | INTRAVENOUS | Status: DC | PRN
  Administered 2023-12-13: 20 mg via INTRAVENOUS

## 2023-12-13 MED ORDER — FENTANYL CITRATE (PF) 100 MCG/2ML IJ SOLN
INTRAMUSCULAR | Status: DC | PRN
Start: 2023-12-13 — End: 2023-12-13
  Administered 2023-12-13: 25 ug via INTRAVENOUS

## 2023-12-13 MED ORDER — ONDANSETRON HCL 4 MG/2ML IJ SOLN
INTRAMUSCULAR | Status: DC | PRN
  Administered 2023-12-13: 4 mg via INTRAVENOUS

## 2023-12-13 MED ORDER — ACETAMINOPHEN 500 MG PO TABS
1000.0000 mg | ORAL_TABLET | Freq: Once | ORAL | Status: AC
  Administered 2023-12-13: 1000 mg via ORAL
  Filled 2023-12-13: qty 2

## 2023-12-13 MED ORDER — FENTANYL CITRATE PF 50 MCG/ML IJ SOSY: 25.0000 ug | PREFILLED_SYRINGE | INTRAMUSCULAR | Status: DC | PRN

## 2023-12-13 MED ORDER — OXYCODONE HCL 5 MG PO TABS: 5.0000 mg | ORAL_TABLET | Freq: Once | ORAL | Status: DC | PRN

## 2023-12-13 MED ORDER — SODIUM CHLORIDE 0.9 % IR SOLN
Status: DC | PRN
  Administered 2023-12-13: 3000 mL

## 2023-12-13 MED ORDER — FENTANYL CITRATE (PF) 100 MCG/2ML IJ SOLN
INTRAMUSCULAR | Status: AC
  Filled 2023-12-13: qty 2

## 2023-12-13 MED ORDER — DEXAMETHASONE SODIUM PHOSPHATE 10 MG/ML IJ SOLN
INTRAMUSCULAR | Status: DC | PRN
  Administered 2023-12-13: 4 mg via INTRAVENOUS

## 2023-12-13 MED ORDER — MIDAZOLAM HCL 2 MG/2ML IJ SOLN
INTRAMUSCULAR | Status: DC | PRN
  Administered 2023-12-13: 2 mg via INTRAVENOUS

## 2023-12-13 MED ORDER — CIPROFLOXACIN IN D5W 400 MG/200ML IV SOLN
400.0000 mg | INTRAVENOUS | Status: AC
  Administered 2023-12-13: 400 mg via INTRAVENOUS
  Filled 2023-12-13: qty 200

## 2023-12-13 MED ORDER — MEPERIDINE HCL 50 MG/ML IJ SOLN: 6.2500 mg | INTRAMUSCULAR | Status: DC | PRN

## 2023-12-13 MED ORDER — LACTATED RINGERS IV SOLN
INTRAVENOUS | Status: DC | PRN
Start: 2023-12-13 — End: 2023-12-13

## 2023-12-13 MED ORDER — MIDAZOLAM HCL 2 MG/2ML IJ SOLN
INTRAMUSCULAR | Status: AC
  Filled 2023-12-13: qty 2

## 2023-12-13 MED ORDER — PROPOFOL 10 MG/ML IV BOLUS
INTRAVENOUS | Status: DC | PRN
  Administered 2023-12-13: 50 mg via INTRAVENOUS
  Administered 2023-12-13: 100 mg via INTRAVENOUS

## 2023-12-13 MED ORDER — MIDAZOLAM HCL 2 MG/2ML IJ SOLN: 0.5000 mg | Freq: Once | INTRAMUSCULAR | Status: DC | PRN

## 2023-12-13 MED ORDER — DEXAMETHASONE SODIUM PHOSPHATE 10 MG/ML IJ SOLN
INTRAMUSCULAR | Status: AC
  Filled 2023-12-13: qty 1

## 2023-12-13 MED ORDER — IOHEXOL 300 MG/ML  SOLN
INTRAMUSCULAR | Status: DC | PRN
  Administered 2023-12-13: 6 mL

## 2023-12-13 SURGICAL SUPPLY — 26 items
BAG URO CATCHER STRL LF (MISCELLANEOUS) ×1 IMPLANT
BASKET ZERO TIP NITINOL 2.4FR (BASKET) IMPLANT
BENZOIN TINCTURE PRP APPL 2/3 (GAUZE/BANDAGES/DRESSINGS) IMPLANT
CATH URETERAL DUAL LUMEN 10F (MISCELLANEOUS) IMPLANT
CATH URETL OPEN 5X70 (CATHETERS) ×1 IMPLANT
CATH URETL OPEN END 6FR 70 (CATHETERS) IMPLANT
CLOTH BEACON ORANGE TIMEOUT ST (SAFETY) ×1 IMPLANT
DRSG TEGADERM 2-3/8X2-3/4 SM (GAUZE/BANDAGES/DRESSINGS) IMPLANT
FIBER LASER MOSES 200 DFL (Laser) IMPLANT
GLOVE BIO SURGEON STRL SZ7 (GLOVE) ×1 IMPLANT
GLOVE BIOGEL M 7.0 STRL (GLOVE) ×1 IMPLANT
GLOVE SURG LX STRL 7.5 STRW (GLOVE) ×1 IMPLANT
GOWN STRL REUS W/ TWL XL LVL3 (GOWN DISPOSABLE) ×1 IMPLANT
GUIDEWIRE STR DUAL SENSOR (WIRE) ×2 IMPLANT
GUIDEWIRE ZIPWRE .038 STRAIGHT (WIRE) IMPLANT
KIT TURNOVER KIT A (KITS) IMPLANT
MANIFOLD NEPTUNE II (INSTRUMENTS) ×1 IMPLANT
NS IRRIG 1000ML POUR BTL (IV SOLUTION) IMPLANT
PACK CYSTO (CUSTOM PROCEDURE TRAY) ×1 IMPLANT
PAD PREP 24X48 CUFFED NSTRL (MISCELLANEOUS) ×1 IMPLANT
SHEATH DILATOR SET 8/10 (MISCELLANEOUS) IMPLANT
SHEATH NAVIGATOR HD 12/14X46 (SHEATH) IMPLANT
STENT URET 6FRX26 CONTOUR (STENTS) IMPLANT
TRACTIP FLEXIVA PULS ID 200XHI (Laser) IMPLANT
TUBING CONNECTING 10 (TUBING) ×1 IMPLANT
TUBING UROLOGY SET (TUBING) ×1 IMPLANT

## 2023-12-13 NOTE — Transfer of Care (Signed)
 Immediate Anesthesia Transfer of Care Note  Patient: Dustin Todd  Procedure(s) Performed: CYSTOSCOPY/URETEROSCOPY/HOLMIUM LASER/STENT PLACEMENT (Left: Ureter) CYSTOSCOPY, WITH RETROGRADE PYELOGRAM (Left: Ureter)  Patient Location: PACU  Anesthesia Type:General  Level of Consciousness: awake, alert , and oriented  Airway & Oxygen Therapy: Patient Spontanous Breathing and Patient connected to face mask oxygen  Post-op Assessment: Report given to RN, Post -op Vital signs reviewed and stable, and Patient moving all extremities X 4  Post vital signs: Reviewed and stable  Last Vitals:  Vitals Value Taken Time  BP 142/84   Temp    Pulse 69 12/13/23 1421  Resp 10 12/13/23 1421  SpO2 100 % 12/13/23 1421  Vitals shown include unfiled device data.  Last Pain:  Vitals:   12/13/23 1107  TempSrc:   PainSc: 0-No pain         Complications: No notable events documented.

## 2023-12-13 NOTE — Anesthesia Procedure Notes (Signed)
 Procedure Name: LMA Insertion Date/Time: 12/13/2023 1:29 PM  Performed by: Ulanda Gambles, CRNAPre-anesthesia Checklist: Patient identified, Emergency Drugs available, Suction available and Patient being monitored Patient Re-evaluated:Patient Re-evaluated prior to induction Oxygen Delivery Method: Circle system utilized Preoxygenation: Pre-oxygenation with 100% oxygen Induction Type: IV induction LMA: LMA inserted LMA Size: 4.0 Number of attempts: 1 Dental Injury: Teeth and Oropharynx as per pre-operative assessment

## 2023-12-13 NOTE — Anesthesia Preprocedure Evaluation (Addendum)
 Anesthesia Evaluation  Patient identified by MRN, date of birth, ID band Patient awake    Reviewed: Allergy & Precautions, NPO status , Patient's Chart, lab work & pertinent test results  History of Anesthesia Complications (+) history of anesthetic complications (passed out at home after surgery last week)  Airway Mallampati: II  TM Distance: >3 FB Neck ROM: Full    Dental  (+) Dental Advisory Given   Pulmonary neg pulmonary ROS   breath sounds clear to auscultation       Cardiovascular negative cardio ROS  Rhythm:Regular Rate:Normal     Neuro/Psych  Headaches    GI/Hepatic negative GI ROS, Neg liver ROS,,,  Endo/Other  negative endocrine ROS    Renal/GU Renal InsufficiencyRenal diseaseCreat improved to 1.35     Musculoskeletal   Abdominal   Peds  Hematology Hb 13.3, plt 158k   Anesthesia Other Findings   Reproductive/Obstetrics                             Anesthesia Physical Anesthesia Plan  ASA: 2  Anesthesia Plan: General   Post-op Pain Management: Tylenol  PO (pre-op)*   Induction: Intravenous  PONV Risk Score and Plan: 2 and Ondansetron  and Dexamethasone   Airway Management Planned: LMA  Additional Equipment: None  Intra-op Plan:   Post-operative Plan:   Informed Consent: I have reviewed the patients History and Physical, chart, labs and discussed the procedure including the risks, benefits and alternatives for the proposed anesthesia with the patient or authorized representative who has indicated his/her understanding and acceptance.     Dental advisory given  Plan Discussed with: CRNA and Surgeon  Anesthesia Plan Comments:         Anesthesia Quick Evaluation

## 2023-12-13 NOTE — Op Note (Signed)
 Operative Note  Preoperative diagnosis:  1.  Left ureteral and renal stone  Postoperative diagnosis: 1.  Left ureteral and renal stone  Procedure(s): 1.  Cystoscopy 2. Left ureteroscopy with laser lithotripsy and basket extraction of stones 3. Left retrograde pyelogram 4. Left ureteral stent placement 5. Fluoroscopy with intraoperative interpretation  Surgeon: Doy Gene, MD  Assistants:  None  Anesthesia:  General  Complications:  None  EBL:  Minimal  Specimens: 1. Stones for stone analysis (to be done at [facility])  Drains/Catheters: 1.  Left 6Fr x 26cm ureteral stent with a tether string  Intraoperative findings:   Cystoscopy demonstrated no suspicious lesions, masses, stones or other pathology.  Left ureteroscopy demonstrated 2 large distal ureteral stones each about 8mm and one small left lower pole all fragmented and extracted. Left retrograde pyelogram demonstrated no hydronephrosis, filling defects or extravasation of contrast. Successful stent placement.  Indication:  Dustin Todd is a 58 y.o. male with a history of bilateral stones here today for left ureteroscopy with laser lithotripsy, left stent exchange.  Description of procedure: After informed consent was obtained from the patient, the patient was identified and taken to the operating room and placed in the supine position.  General anesthesia was administered as well as perioperative IV antibiotics.  At the beginning of the case, a time-out was performed to properly identify the patient, the surgery to be performed, and the surgical site.  Sequential compression devices were applied to the lower extremities at the beginning of the case for DVT prophylaxis.  The patient was then placed in the dorsal lithotomy supine position, prepped and draped in sterile fashion.  We then passed the 21-French rigid cystoscope through the urethra and into the bladder under vision without any difficulty, noting a  normal urethra without strictures and a mildly obstructing prostate.  A systematic evaluation of the bladder revealed no evidence of any suspicious bladder lesions.  Ureteral orifices were in normal position.    The distal aspect of the ureteral stent was seen protruding from the left ureteral orifice.  We then used the alligator-tooth forceps and grasped the distal end of the ureteral stent and brought it out the urethral meatus while watching the proximal coil straighten out nicely on fluoroscopy. Through the ureteral stent, we then passed a 0.038 sensor wire up to the level of the renal pelvis.  The ureteral stent was then removed, leaving the sensor wire up the left ureter.    Under cystoscopic and flouroscopic guidance, we cannulated the left ureteral orifice with a 5-French open-ended ureteral catheter and a gentle retrograde pyelogram was performed, revealing a normal caliber ureter without any filling defects. There was no hydronephrosis of the collecting system. A 0.038 sensor wire was then passed up to the level of the renal pelvis and secured to the drape as a safety wire. The ureteral catheter and cystoscope were removed, leaving the safety wire in place.   A semi-rigid ureteroscope was passed alongside the wire up the distal ureter which appeared normal. I encountered 2 large stones each about 8mm in the distal left ureter. The calculus was identified at the distal left ureter. Using the 272 micron holmium laser fiber, the stone was fragmented completely. A 2.2 Fr zero tip basket was used to remove the fragments under visual guidance. I then passed a separate wire and a flexible scope into the collecting system. I encountered a small stone. This was dusted. With the ureteroscope in the kidney, a gentle pyelogram was  performed to delineate the calyceal system and we evaluated the calyces systematically. We encountered no further large stones. The rest of the stone fragments were very tiny and these  were  irrigated away gently. The calyces were re-inspected and there were no significant stone fragment residual.   We then withdrew the ureteroscope back down the ureter noting no evidence of any stones along the course of the ureter.  Prior to removing the ureteroscope, we did pass the Glidewire back up to the ureter to the renal pelvis.    Once the ureteroscope was removed, the Glidewire was backloaded through the rigid cystoscope, which was then advanced down the urethra and into the bladder. We then used the Glidewire under direct vision through the rigid cystoscope and under fluoroscopic guidance and passed up a 6-French, 26 cm double-pigtail ureteral stent up ureter, making sure that the proximal and distal ends coiled within the kidney and bladder respectively.  Note that we left a long tether string attached to the distal end of the ureteral stent and it exited the urethral meatus and was secured to the penile shaft with a tegaderm adhesive.  The cystoscope was then advanced back into the bladder under vision.  We were able to see the distal stent coiling nicely within the bladder.  The bladder was then emptied with irrigation solution.  The cystoscope was then removed.    The patient tolerated the procedure well and there was no complication. Patient was awoken from anesthesia and taken to the recovery room in stable condition. I was present and scrubbed for the entirety of the case.  Plan:  Patient will be discharged home.    Matt R. Eliot Bencivenga MD [facility]  Pager: [phone]

## 2023-12-13 NOTE — H&P (Signed)
 Office Visit Report     11/26/2023   --------------------------------------------------------------------------------   Melanie Spires  MRN: [id number]  DOB: 10/24/1965, 58 year old Male  SSN: [id number]   PRIMARY CARE:  Tretha Fu, NP  PRIMARY CARE FAX:  [id number]  REFERRING:  Tretha Fu, NP  PROVIDER:  Doy Gene, M.D.  LOCATION:  [facility] [phone]     --------------------------------------------------------------------------------   CC/HPI: Ruhan Gofman is a 58 year old male who is seen in consultation today for urolithiasis.   #1. Distal left ureteral stone:  -He presented to the hospital in 10/2023 with complaints of lower abdominal pain. CT A/P 11/17/2023 revealed 2 distal left ureteral stones lodged just above the left UPJ measuring 7 and 11 mm resulting in moderate left sided hydronephrosis. He was also found to have a 7 mm stone in the lower pole of the left kidney.  - He underwent left ureteral stent placement by Dr. Jarvis Mesa on 11/17/2023.  - He denies abdominal pain or flank pain.   #2. Staghorn right renal stone: CT 11/17/2023 also revealed staghorn stone of the right kidney with obstructing component. He underwent right nephrostomy tube placement by Dr. Jinx Mourning on 11/19/2023.   He denies abdominal pain or flank pain. Right nephrostomy tube has been draining clear yellow urine. He denies fevers or chills.   Of note, he has prior history of urolithiasis requiring ESWL in the past in 2001. He also has history of passing multiple stones. He has family history of urolithiasis.   He has a past medical history of anxiety. He has no prior abdominal surgeries.   He works as an Architect.     ALLERGIES: penicillin    MEDICATIONS: Tamsulosin  HCl 0.4 MG Capsule  Azelastine  HCl 0.1 % Solution  Cefadroxil  500 MG Capsule  DiazePAM 10 MG Oral Tablet 0 Oral  Hydrocodone-Acetaminophen  5-325 MG Oral Tablet 0 Oral     GU PSH: No GU PSH      PSH  Notes: No Surgical Problems   NON-GU PSH: No Non-GU PSH    GU PMH: No GU PMH    NON-GU PMH: Encounter for vasectomy consultation, Encounter for vasectomy counseling - 2017 Anxiety, Anxiety - 2016 Encounter for general adult medical examination without abnormal findings, Encounter for preventive health examination - 2016 Personal history of other specified conditions, History of heartburn - 2016    FAMILY HISTORY: Kidney Stones - Runs In Family   SOCIAL HISTORY: No Social History     Notes: Caffeine use, Married, Occupation, Never a smoker, Number of children, Alcohol use, Patient's father is still living, Patient's mother is still living   REVIEW OF SYSTEMS:    GU Review Male:   Patient denies frequent urination, hard to postpone urination, burning/ pain with urination, get up at night to urinate, leakage of urine, stream starts and stops, trouble starting your stream, have to strain to urinate , erection problems, and penile pain.  Gastrointestinal (Upper):   Patient denies nausea, vomiting, and indigestion/ heartburn.  Gastrointestinal (Lower):   Patient denies diarrhea and constipation.  Constitutional:   Patient denies fever, night sweats, weight loss, and fatigue.  Skin:   Patient denies skin rash/ lesion and itching.  Eyes:   Patient denies blurred vision and double vision.  Ears/ Nose/ Throat:   Patient denies sore throat and sinus problems.  Hematologic/Lymphatic:   Patient denies swollen glands and easy bruising.  Cardiovascular:   Patient denies leg swelling and chest pains.  Respiratory:   Patient  denies cough and shortness of breath.  Endocrine:   Patient denies excessive thirst.  Musculoskeletal:   Patient denies back pain and joint pain.  Neurological:   Patient denies headaches and dizziness.  Psychologic:   Patient denies depression and anxiety.   VITAL SIGNS:      11/26/2023 03:30 PM  Weight 180 lb / 81.65 kg  Height 68 in / 172.72 cm  BP 133/83 mmHg  Pulse 58  /min  Temperature 98.3 F / 36.8 C  BMI 27.4 kg/m   MULTI-SYSTEM PHYSICAL EXAMINATION:    Constitutional: Well-nourished. No physical deformities. Normally developed. Good grooming.  Respiratory: No labored breathing, no use of accessory muscles.   Cardiovascular: Normal temperature, normal extremity pulses, no swelling, no varicosities.  Gastrointestinal: No mass, no tenderness, no rigidity, non obese abdomen.     Complexity of Data:  Source Of History:  Patient, Medical Record Summary  Records Review:   Previous Doctor Records, Previous Patient Records  Urine Test Review:   Urinalysis  X-Ray Review: C.T. Abdomen/Pelvis: Reviewed Films. Reviewed Report. Discussed With Patient.     PROCEDURES:          Visit Complexity - G2211          Urinalysis Dipstick Dipstick Cont'd  Color: Yellow Bilirubin: Neg mg/dL  Appearance: Clear Ketones: Neg mg/dL  Specific Gravity: <=1.610 Blood: Neg ery/uL  pH: 6.5 Protein: Trace mg/dL  Glucose: Neg mg/dL Urobilinogen: 0.2 mg/dL    Nitrites: Neg    Leukocyte Esterase: Neg leu/uL    ASSESSMENT:      ICD-10 Details  1 GU:   Renal calculus - N20.0   2   Ureteral calculus - N20.1    PLAN:           Orders Labs Urine Culture  X-Rays: Interventional Radiology With I.V. Contrast. No Oral Contrast - right nephroureteral stent placement          Schedule Return Visit/Planned Activity: Next Available Appointment - Schedule Surgery          Document Letter(s):  Created for Patient: Clinical Summary         Notes:    1. Left ureteral and renal stone: Recommend proceeding with ureteroscopy with laser lithotripsy and basket extraction of stone. Circular submitted.   #2. Right staghorn stone: Will have interventional radiology exchange right nephrostomy tube for a right nephroureteral stent.  - Will plan to schedule staged right PCNL.   We discussed the options for management of kidney stones, including observation, ESWL, ureteroscopy with  laser lithotripsy, and PCNL. The risks and benefits of each option were discussed.  For observation I described the risks which include but are not limited to silent renal damage, life-threatening infection, need for emergent surgery, failure to pass stone, and pain.   ESWL: risks and benefits of ESWL were outlined including infection, bleeding, pain, steinstrasse, kidney injury, need for ancillary treatments, and global anesthesia risks including but not limited to CVA, MI, DVT, PE, pneumonia, and death.   Ureteroscopy: risks and benefits of ureteroscopy were outlined, including infection, bleeding, pain, temporary ureteral stent and associated stent bother, ureteral injury, ureteral stricture, need for ancillary treatments, and global anesthesia risks including but not limited to CVA, MI, DVT, PE, pneumonia, and death.   PCNL: risks and benefits of PCNL were outlined including infection, bleeding, blood transfusion, pain, pneumothorax, bowel injury, persistent urine leak, positioning injury, inability to clear stone burden, renal laceration, arterial venous fistula or malformation, need for ancillary treatments, and  global anesthesia risks including but not limited to CVA, MI, DVT, PE, pneumonia, and death.    Will send urine for culture today.   CC: Tretha Fu, NP   Urology Preoperative H&P   Chief Complaint: Left ureteral and renal stones  History of Present Illness: Tryone R Badia is a 58 y.o. male with left ureteral and renal stones here for cysto, L URS/LL, L RPG, L stent exchange. Denies fevers, chills, dysuria.    Past Medical History:  Diagnosis Date   History of kidney stones     Past Surgical History:  Procedure Laterality Date   CYSTOSCOPY WITH STENT PLACEMENT Left 11/17/2023   Procedure: CYSTOSCOPY, WITH STENT INSERTION;  Surgeon: Mallie Seal, MD;  Location: [facility];  Service: Urology;  Laterality: Left;   IR CONVERT RIGHT NEPHROSTOMY TO NEPHROURETERAL CATH   12/06/2023   IR NEPHROSTOMY PLACEMENT RIGHT  11/19/2023   LITHOTRIPSY      Allergies:  Allergies  Allergen Reactions   Gluten Meal Other (See Comments)    Break outs, stomach upset   Penicillins Rash    Family History  Problem Relation Age of Onset   Healthy Mother    Heart disease Father        pacemaker   Cancer Maternal Grandmother        Breast   Cancer Paternal Grandfather        Bone    Social History:  reports that he has never smoked. He has never used smokeless tobacco. He reports that he does not currently use alcohol. He reports that he does not use drugs.  ROS: A complete review of systems was performed.  All systems are negative except for pertinent findings as noted.  Physical Exam:  Vital signs in last 24 hours: Temp:  [98.8 F (37.1 C)] 98.8 F (37.1 C) (05/16 1052) Pulse Rate:  [69] 69 (05/16 1052) Resp:  [16] 16 (05/16 1052) BP: (140)/(82) 140/82 (05/16 1052) SpO2:  [98 %] 98 % (05/16 1052) Weight:  [76.2 kg] 76.2 kg (05/16 1107) Constitutional:  Alert and oriented, No acute distress Cardiovascular: Regular rate and rhythm Respiratory: Normal respiratory effort, Lungs clear bilaterally GI: Abdomen is soft, nontender, nondistended, no abdominal masses GU: No CVA tenderness Lymphatic: No lymphadenopathy Neurologic: Grossly intact, no focal deficits Psychiatric: Normal mood and affect  Laboratory Data:  No results for input(s): "WBC", "HGB", "HCT", "PLT" in the last 72 hours.  No results for input(s): "NA", "K", "CL", "GLUCOSE", "BUN", "CALCIUM", "CREATININE" in the last 72 hours.  Invalid input(s): "CO3"   No results found for this or any previous visit (from the past 24 hours). No results found for this or any previous visit (from the past 240 hours).  Renal Function: Recent Labs    12/06/23 2005  CREATININE 1.35*   Estimated Creatinine Clearance: 59.8 mL/min (A) (by C-G formula based on SCr of 1.35 mg/dL (H)).  Radiologic Imaging: No  results found.  I independently reviewed the above imaging studies.  Assessment and Plan Kayvion R Lince is a 58 y.o. male with left ureteral and renal stones here for cysto, L URS/LL, L RPG, L stent exchange.  -The risks, benefits and alternatives of cystoscopy with left ureteral and renal stones here for cysto, L URS/LL, L RPG, L stent exchange was discussed with the patient.  Risks include, but are not limited to: bleeding, urinary tract infection, ureteral injury, ureteral stricture disease, chronic pain, urinary symptoms, bladder injury, stent migration, the need for nephrostomy tube  placement, MI, CVA, DVT, PE and the inherent risks with general anesthesia.  The patient voices understanding and wishes to proceed.    Matt R. Landrie Beale MD 12/13/2023, 1:01 PM  [facility] Pager: [phone]

## 2023-12-13 NOTE — Discharge Instructions (Signed)
 [facility] [phone] Post Ureteroscopy With or Without Stent Instructions  Definitions:  Ureter: The duct that transports urine from the kidney to the bladder. Stent:   A plastic hollow tube that is placed into the ureter, from the kidney to the bladder to prevent the ureter from swelling shut.  GENERAL INSTRUCTIONS:  Despite the fact that no skin incisions were used, the area around the ureter and bladder is raw and irritated. The stent is a foreign body which will further irritate the bladder wall. This irritation is manifested by increased frequency of urination, both day and night, and by an increase in the urge to urinate. In some, the urge to urinate is present almost always. Sometimes the urge is strong enough that you may not be able to stop yourself from urinating. The only real cure is to remove the stent and then give time for the bladder wall to heal which can't be done until the danger of the ureter swelling shut has passed, which varies.  You may see some blood in your urine while the stent is in place and a few days afterwards. Do not be alarmed, even if the urine was clear for a while. Get off your feet and drink lots of fluids until clearing occurs. If you start to pass clots or don't improve, call us .  DIET: You may return to your normal diet immediately. Because of the raw surface of your bladder, alcohol, spicy foods, acid type foods and drinks with caffeine may cause irritation or frequency and should be used in moderation. To keep your urine flowing freely and to avoid constipation, drink plenty of fluids during the day ( 8-10 glasses ). Tip: Avoid cranberry juice because it is very acidic.  ACTIVITY: Your physical activity doesn't need to be restricted. However, if you are very active, you may see some blood in your urine. We suggest that you reduce your activity under these circumstances until the bleeding has stopped.  BOWELS: It is important to  keep your bowels regular during the postoperative period. Straining with bowel movements can cause bleeding. A bowel movement every other day is reasonable. Use a mild laxative if needed, such as Milk of Magnesia 2-3 tablespoons, or 2 Dulcolax tablets. Call if you continue to have problems. If you have been taking narcotics for pain, before, during or after your surgery, you may be constipated. Take a laxative if necessary.   MEDICATION: You should resume your pre-surgery medications unless told not to. In addition you will often be given an antibiotic to prevent infection. These should be taken as prescribed until the bottles are finished unless you are having an unusual reaction to one of the drugs.  PROBLEMS YOU SHOULD REPORT TO US : Fevers over 100.5 Fahrenheit. Heavy bleeding, or clots ( See above notes about blood in urine ). Inability to urinate. Drug reactions ( hives, rash, nausea, vomiting, diarrhea ). Severe burning or pain with urination that is not improving.  FOLLOW-UP: You will need a follow-up appointment to monitor your progress. Call for this appointment at the number listed above. Usually the first appointment will be about three to fourteen days after your surgery.  You have a ureteral stent attached to a string and this may be removed on Monday AM.

## 2023-12-13 NOTE — Anesthesia Postprocedure Evaluation (Signed)
 Anesthesia Post Note  Patient: Dustin Todd  Procedure(s) Performed: CYSTOSCOPY/URETEROSCOPY/HOLMIUM LASER/STENT PLACEMENT (Left: Ureter) CYSTOSCOPY, WITH RETROGRADE PYELOGRAM (Left: Ureter)     Patient location during evaluation: PACU Anesthesia Type: General Level of consciousness: awake and alert, oriented and patient cooperative Pain management: pain level controlled Vital Signs Assessment: post-procedure vital signs reviewed and stable Respiratory status: spontaneous breathing, nonlabored ventilation and respiratory function stable Cardiovascular status: blood pressure returned to baseline and stable Postop Assessment: no apparent nausea or vomiting Anesthetic complications: no   No notable events documented.  Last Vitals:  Vitals:   12/13/23 1421 12/13/23 1430  BP: (!) 142/84 128/76  Pulse: 69 65  Resp: 10 13  Temp: (!) 36.3 C   SpO2: 100% 97%    Last Pain:  Vitals:   12/13/23 1430  TempSrc:   PainSc: 0-No pain                 Tamsen Reist,E. Niah Heinle

## 2023-12-14 ENCOUNTER — Encounter (HOSPITAL_COMMUNITY): Payer: Self-pay | Admitting: Urology

## 2023-12-25 ENCOUNTER — Other Ambulatory Visit (HOSPITAL_COMMUNITY): Payer: Self-pay

## 2023-12-25 MED ORDER — NORMAL SALINE FLUSH 0.9 % IV SOLN
INTRAVENOUS | 3 refills | Status: AC
  Filled 2023-12-25: qty 300, 30d supply, fill #0

## 2023-12-26 ENCOUNTER — Other Ambulatory Visit: Payer: Self-pay | Admitting: Urology

## 2023-12-26 NOTE — Progress Notes (Signed)
 SURGICAL WAITING ROOM VISITATION  Patients having surgery or a procedure may have no more than 2 support people in the waiting area - these visitors may rotate.    Children under the age of 87 must have an adult with them who is not the patient.  Due to an increase in RSV and influenza rates and associated hospitalizations, children ages 40 and under may not visit patients in [facility] hospitals.  Visitors with respiratory illnesses are discouraged from visiting and should remain at home.  If the patient needs to stay at the hospital during part of their recovery, the visitor guidelines for inpatient rooms apply. Pre-op nurse will coordinate an appropriate time for 1 support person to accompany patient in pre-op.  This support person may not rotate.    Please refer to the [facility] website for the visitor guidelines for Inpatients (after your surgery is over and you are in a regular room).       Your procedure is scheduled on: 01/10/2024    Report to [facility] Main Entrance    Report to admitting at  1000 AM   Call this number if you have problems the morning of surgery [phone]   Do not eat food :After Midnight.   After Midnight you may have the following liquids until _0900_____ AM/ DAY OF SURGERY  Water  Non-Citrus Juices (without pulp, NO RED-Apple, White grape, White cranberry) Black Coffee (NO MILK/CREAM OR CREAMERS, sugar ok)  Clear Tea (NO MILK/CREAM OR CREAMERS, sugar ok) regular and decaf                             Plain Jell-O (NO RED)                                           Fruit ices (not with fruit pulp, NO RED)                                     Popsicles (NO RED)                                                               Sports drinks like Gatorade (NO RED)                         Oral Hygiene is also important to reduce your risk of infection.                                    Remember - BRUSH YOUR TEETH THE MORNING OF SURGERY WITH  YOUR REGULAR TOOTHPASTE  DENTURES WILL BE REMOVED PRIOR TO SURGERY PLEASE DO NOT APPLY "Poly grip" OR ADHESIVES!!!   Do NOT smoke after Midnight   Stop all vitamins and herbal supplements 7 days before surgery.   Take these medicines the morning of surgery with A SIP OF WATER : zyrtec  if needed   DO NOT TAKE ANY ORAL DIABETIC MEDICATIONS DAY OF YOUR SURGERY  Bring CPAP mask and tubing day of surgery.                              You may not have any metal on your body including hair pins, jewelry, and body piercing             Do not wear make-up, lotions, powders, perfumes/cologne, or deodorant  Do not wear nail polish including gel and S&S, artificial/acrylic nails, or any other type of covering on natural nails including finger and toenails. If you have artificial nails, gel coating, etc. that needs to be removed by a nail salon please have this removed prior to surgery or surgery may need to be canceled/ delayed if the surgeon/ anesthesia feels like they are unable to be safely monitored.   Do not shave  48 hours prior to surgery.               Men may shave face and neck.   Do not bring valuables to the hospital. [facility] IS NOT             RESPONSIBLE   FOR VALUABLES.   Contacts, glasses, dentures or bridgework may not be worn into surgery.   Bring small overnight bag day of surgery.   DO NOT BRING YOUR HOME MEDICATIONS TO THE HOSPITAL. PHARMACY WILL DISPENSE MEDICATIONS LISTED ON YOUR MEDICATION LIST TO YOU DURING YOUR ADMISSION IN THE HOSPITAL!    Patients discharged on the day of surgery will not be allowed to drive home.  Someone NEEDS to stay with you for the first 24 hours after anesthesia.   Special Instructions: Bring a copy of your healthcare power of attorney and living will documents the day of surgery if you haven't scanned them before.              Please read over the following fact sheets you were given: IF YOU HAVE QUESTIONS ABOUT YOUR PRE-OP INSTRUCTIONS  PLEASE CALL [phone]   If you received a COVID test during your pre-op visit  it is requested that you wear a mask when out in public, stay away from anyone that may not be feeling well and notify your surgeon if you develop symptoms. If you test positive for Covid or have been in contact with anyone that has tested positive in the last 10 days please notify you surgeon.    [facility] - Preparing for Surgery Before surgery, you can play an important role.  Because skin is not sterile, your skin needs to be as free of germs as possible.  You can reduce the number of germs on your skin by washing with CHG (chlorahexidine gluconate) soap before surgery.  CHG is an antiseptic cleaner which kills germs and bonds with the skin to continue killing germs even after washing. Please DO NOT use if you have an allergy to CHG or antibacterial soaps.  If your skin becomes reddened/irritated stop using the CHG and inform your nurse when you arrive at Short Stay. Do not shave (including legs and underarms) for at least 48 hours prior to the first CHG shower.  You may shave your face/neck. Please follow these instructions carefully:  1.  Shower with CHG Soap the night before surgery and the  morning of Surgery.  2.  If you choose to wash your hair, wash your hair first as usual with your  normal  shampoo.  3.  After you shampoo,  rinse your hair and body thoroughly to remove the  shampoo.                           4.  Use CHG as you would any other liquid soap.  You can apply chg directly  to the skin and wash                       Gently with a scrungie or clean washcloth.  5.  Apply the CHG Soap to your body ONLY FROM THE NECK DOWN.   Do not use on face/ open                           Wound or open sores. Avoid contact with eyes, ears mouth and genitals (private parts).                       Wash face,  Genitals (private parts) with your normal soap.             6.  Wash thoroughly, paying special attention to  the area where your surgery  will be performed.  7.  Thoroughly rinse your body with warm water  from the neck down.  8.  DO NOT shower/wash with your normal soap after using and rinsing off  the CHG Soap.                9.  Pat yourself dry with a clean towel.            10.  Wear clean pajamas.            11.  Place clean sheets on your bed the night of your first shower and do not  sleep with pets. Day of Surgery : Do not apply any lotions/deodorants the morning of surgery.  Please wear clean clothes to the hospital/surgery center.  FAILURE TO FOLLOW THESE INSTRUCTIONS MAY RESULT IN THE CANCELLATION OF YOUR SURGERY PATIENT SIGNATURE_________________________________  NURSE SIGNATURE__________________________________  ________________________________________________________________________

## 2023-12-26 NOTE — Progress Notes (Signed)
 Surgery orders requested via Epic inbox.

## 2023-12-31 ENCOUNTER — Encounter (HOSPITAL_COMMUNITY): Payer: Self-pay | Admitting: Urology

## 2023-12-31 ENCOUNTER — Other Ambulatory Visit: Payer: Self-pay

## 2023-12-31 ENCOUNTER — Encounter (HOSPITAL_COMMUNITY)
Admission: RE | Admit: 2023-12-31 | Discharge: 2023-12-31 | Disposition: A | Discharge status: Home / Self Care | Source: Ambulatory Visit | Attending: Urology | Admitting: Urology

## 2023-12-31 DIAGNOSIS — Z01818 Encounter for other preprocedural examination: Secondary | ICD-10-CM | POA: Diagnosis present

## 2023-12-31 NOTE — Progress Notes (Addendum)
 Anesthesia Review:  PCP: none  Cardiologist : none   PPM/ ICD: Device Orders: Rep Notified:  Chest x-ray : EKG : Echo : Stress test: Cardiac Cath :   Activity level: can do a flight of stairs without difficutly  Sleep Study/ CPAP : none  Fasting Blood Sugar :      / Checks Blood Sugar -- times a day:    Blood Thinner/ Instructions /Last Dose: ASA / Instructions/ Last Dose :    11/17/23-cysto- pt states with this procedure he passed out when he got home and had to be brought back to ED - attributed to anesthesia and dehydration  12/13/2023- cysto - no issues with this episode    Completed preop phone call appt on 12/31/2023.  Med hx and proep instructions conmpleted.  PT instructed to call Admitting at [phone] at end of phone call.  Pt voiced understanding.    Surgery case states cystoscopy , litho, etc OR consent order states First Stage PErc Nephro laser lithotripy , stent Called and LVMM for [name] at office.  For clarificaiton .  PT states cysto and litho

## 2024-01-10 ENCOUNTER — Encounter (HOSPITAL_COMMUNITY)
Admission: AD | Disposition: A | Discharge status: Home / Self Care | Payer: Self-pay | Source: Home / Self Care | Attending: Urology

## 2024-01-10 ENCOUNTER — Observation Stay (HOSPITAL_COMMUNITY)
Admission: AD | Admit: 2024-01-10 | Discharge: 2024-01-14 | Disposition: A | Discharge status: Home / Self Care | Attending: Urology | Admitting: Urology

## 2024-01-10 ENCOUNTER — Encounter (HOSPITAL_COMMUNITY): Payer: Self-pay | Admitting: Urology

## 2024-01-10 ENCOUNTER — Ambulatory Visit (HOSPITAL_COMMUNITY): Payer: Self-pay | Admitting: Physician Assistant

## 2024-01-10 ENCOUNTER — Ambulatory Visit (HOSPITAL_COMMUNITY)

## 2024-01-10 ENCOUNTER — Other Ambulatory Visit: Payer: Self-pay

## 2024-01-10 DIAGNOSIS — N132 Hydronephrosis with renal and ureteral calculous obstruction: Secondary | ICD-10-CM | POA: Diagnosis not present

## 2024-01-10 DIAGNOSIS — Z01818 Encounter for other preprocedural examination: Principal | ICD-10-CM

## 2024-01-10 DIAGNOSIS — N200 Calculus of kidney: Secondary | ICD-10-CM

## 2024-01-10 DIAGNOSIS — N209 Urinary calculus, unspecified: Secondary | ICD-10-CM | POA: Diagnosis present

## 2024-01-10 HISTORY — DX: Other complications of anesthesia, initial encounter: T88.59XA

## 2024-01-10 HISTORY — PX: NEPHROLITHOTOMY: SHX5134

## 2024-01-10 LAB — BASIC METABOLIC PANEL WITH GFR
Anion gap: 11 (ref 5–15)
Anion gap: 8 (ref 5–15)
BUN: 10 mg/dL (ref 6–20)
BUN: 10 mg/dL (ref 6–20)
CO2: 23 mmol/L (ref 22–32)
CO2: 25 mmol/L (ref 22–32)
Calcium: 9.6 mg/dL (ref 8.9–10.3)
Calcium: 9.5 mg/dL (ref 8.9–10.3)
Chloride: 103 mmol/L (ref 98–111)
Chloride: 104 mmol/L (ref 98–111)
Creatinine, Ser: 1.03 mg/dL (ref 0.61–1.24)
Creatinine, Ser: 1.17 mg/dL (ref 0.61–1.24)
GFR, Estimated: >60 mL/min (ref >60)
GFR, Estimated: >60 mL/min (ref >60)
Glucose, Bld: 104 mg/dL — ABNORMAL HIGH (ref 70–99)
Glucose, Bld: 120 mg/dL — ABNORMAL HIGH (ref 70–99)
Potassium: 3.9 mmol/L (ref 3.5–5.1)
Potassium: 4.5 mmol/L (ref 3.5–5.1)
Sodium: 137 mmol/L (ref 135–145)
Sodium: 137 mmol/L (ref 135–145)

## 2024-01-10 LAB — CBC
HCT: 39.6 % (ref 39.0–52.0)
HCT: 40.8 % (ref 39.0–52.0)
Hemoglobin: 13.5 g/dL (ref 13.0–17.0)
Hemoglobin: 14.2 g/dL (ref 13.0–17.0)
MCH: 30.1 pg (ref 26.0–34.0)
MCH: 31.1 pg (ref 26.0–34.0)
MCHC: 34.1 g/dL (ref 30.0–36.0)
MCHC: 34.8 g/dL (ref 30.0–36.0)
MCV: 88.4 fL (ref 80.0–100.0)
MCV: 89.3 fL (ref 80.0–100.0)
Platelets: 159 10*3/uL (ref 150–400)
Platelets: 142 10*3/uL — ABNORMAL LOW (ref 150–400)
RBC: 4.48 MIL/uL (ref 4.22–5.81)
RBC: 4.57 MIL/uL (ref 4.22–5.81)
RDW: 13 % (ref 11.5–15.5)
RDW: 13.1 % (ref 11.5–15.5)
WBC: 5.2 10*3/uL (ref 4.0–10.5)
WBC: 7.5 10*3/uL (ref 4.0–10.5)
nRBC: 0 % (ref 0.0–0.2)
nRBC: 0 % (ref 0.0–0.2)

## 2024-01-10 SURGERY — NEPHROLITHOTOMY PERCUTANEOUS
Anesthesia: General | Laterality: Right

## 2024-01-10 MED ORDER — ACETAMINOPHEN 325 MG PO TABS
650.0000 mg | ORAL_TABLET | ORAL | Status: AC | PRN
Start: 2024-01-10 — End: ?

## 2024-01-10 MED ORDER — LORATADINE 10 MG PO TABS
10.0000 mg | ORAL_TABLET | Freq: Every day | ORAL | Status: DC
  Filled 2024-01-10: qty 1

## 2024-01-10 MED ORDER — PROPOFOL 10 MG/ML IV BOLUS
INTRAVENOUS | Status: DC | PRN
  Administered 2024-01-10: 140 mg via INTRAVENOUS

## 2024-01-10 MED ORDER — ACETAMINOPHEN 10 MG/ML IV SOLN
INTRAVENOUS | Status: AC
  Filled 2024-01-10: qty 100

## 2024-01-10 MED ORDER — PHENYLEPHRINE HCL-NACL 20-0.9 MG/250ML-% IV SOLN
INTRAVENOUS | Status: DC | PRN
  Administered 2024-01-10: 25 ug/min via INTRAVENOUS

## 2024-01-10 MED ORDER — ROCURONIUM BROMIDE 10 MG/ML (PF) SYRINGE
PREFILLED_SYRINGE | INTRAVENOUS | Status: DC | PRN
  Administered 2024-01-10: 20 mg via INTRAVENOUS
  Administered 2024-01-10: 50 mg via INTRAVENOUS

## 2024-01-10 MED ORDER — ZOLPIDEM TARTRATE 5 MG PO TABS
5.0000 mg | ORAL_TABLET | Freq: Every evening | ORAL | Status: AC | PRN
Start: 2024-01-10 — End: ?

## 2024-01-10 MED ORDER — FENTANYL CITRATE (PF) 100 MCG/2ML IJ SOLN
INTRAMUSCULAR | Status: AC
  Filled 2024-01-10: qty 2

## 2024-01-10 MED ORDER — SODIUM CHLORIDE 0.9 % IR SOLN
Status: DC | PRN
Start: 2024-01-10 — End: 2024-01-10
  Administered 2024-01-10: 6000 mL
  Administered 2024-01-10: 3000 mL
  Administered 2024-01-10: 6000 mL

## 2024-01-10 MED ORDER — ONDANSETRON HCL 4 MG/2ML IJ SOLN
4.0000 mg | INTRAMUSCULAR | Status: DC | PRN
  Administered 2024-01-13: 4 mg via INTRAVENOUS

## 2024-01-10 MED ORDER — OXYCODONE-ACETAMINOPHEN 5-325 MG PO TABS: 1.0000 | ORAL_TABLET | ORAL | Status: DC | PRN

## 2024-01-10 MED ORDER — ACETAMINOPHEN 10 MG/ML IV SOLN
1000.0000 mg | Freq: Once | INTRAVENOUS | Status: DC | PRN
  Administered 2024-01-10: 1000 mg via INTRAVENOUS

## 2024-01-10 MED ORDER — DROPERIDOL 2.5 MG/ML IJ SOLN
0.6250 mg | Freq: Once | INTRAMUSCULAR | Status: DC | PRN
Start: 2024-01-10 — End: 2024-01-10

## 2024-01-10 MED ORDER — DEXAMETHASONE SODIUM PHOSPHATE 10 MG/ML IJ SOLN
INTRAMUSCULAR | Status: DC | PRN
  Administered 2024-01-10: 10 mg via INTRAVENOUS

## 2024-01-10 MED ORDER — DIPHENHYDRAMINE HCL 50 MG/ML IJ SOLN: 12.5000 mg | Freq: Four times a day (QID) | INTRAMUSCULAR | Status: DC | PRN

## 2024-01-10 MED ORDER — SODIUM CHLORIDE 0.9% FLUSH: 3.0000 mL | INTRAVENOUS | Status: DC | PRN

## 2024-01-10 MED ORDER — MIDAZOLAM HCL 2 MG/2ML IJ SOLN
INTRAMUSCULAR | Status: DC | PRN
  Administered 2024-01-10: 2 mg via INTRAVENOUS

## 2024-01-10 MED ORDER — MIDAZOLAM HCL 2 MG/2ML IJ SOLN
INTRAMUSCULAR | Status: AC
  Filled 2024-01-10: qty 2

## 2024-01-10 MED ORDER — HYDROMORPHONE HCL 1 MG/ML IJ SOLN: 0.5000 mg | INTRAMUSCULAR | Status: DC | PRN

## 2024-01-10 MED ORDER — ONDANSETRON HCL 4 MG/2ML IJ SOLN
INTRAMUSCULAR | Status: DC | PRN
  Administered 2024-01-10: 4 mg via INTRAVENOUS

## 2024-01-10 MED ORDER — FENTANYL CITRATE (PF) 250 MCG/5ML IJ SOLN
INTRAMUSCULAR | Status: DC | PRN
  Administered 2024-01-10: 25 ug via INTRAVENOUS
  Administered 2024-01-10 (×2): 50 ug via INTRAVENOUS
  Administered 2024-01-10: 25 ug via INTRAVENOUS
  Administered 2024-01-10: 50 ug via INTRAVENOUS

## 2024-01-10 MED ORDER — OXYCODONE HCL 5 MG PO TABS: 5.0000 mg | ORAL_TABLET | Freq: Once | ORAL | Status: DC | PRN

## 2024-01-10 MED ORDER — ORAL CARE MOUTH RINSE: 15.0000 mL | Freq: Once | OROMUCOSAL | Status: AC

## 2024-01-10 MED ORDER — FENTANYL CITRATE (PF) 100 MCG/2ML IJ SOLN
INTRAMUSCULAR | Status: AC
Start: 2024-01-10 — End: 2024-01-10
  Filled 2024-01-10: qty 2

## 2024-01-10 MED ORDER — LIDOCAINE HCL (PF) 2 % IJ SOLN
INTRAMUSCULAR | Status: DC | PRN
  Administered 2024-01-10: 80 mg via INTRADERMAL

## 2024-01-10 MED ORDER — CHLORHEXIDINE GLUCONATE CLOTH 2 % EX PADS
6.0000 | MEDICATED_PAD | Freq: Every day | CUTANEOUS | Status: DC
  Administered 2024-01-10 – 2024-01-12 (×3): 6 via TOPICAL

## 2024-01-10 MED ORDER — SODIUM CHLORIDE 0.9% FLUSH
3.0000 mL | Freq: Two times a day (BID) | INTRAVENOUS | Status: DC
  Administered 2024-01-11 – 2024-01-12 (×5): 3 mL via INTRAVENOUS

## 2024-01-10 MED ORDER — FENTANYL CITRATE PF 50 MCG/ML IJ SOSY: 25.0000 ug | PREFILLED_SYRINGE | INTRAMUSCULAR | Status: DC | PRN

## 2024-01-10 MED ORDER — SODIUM CHLORIDE 0.9 % IV SOLN
INTRAVENOUS | Status: DC | PRN
  Administered 2024-01-10: 35 mL

## 2024-01-10 MED ORDER — LACTATED RINGERS IV SOLN: INTRAVENOUS | Status: DC

## 2024-01-10 MED ORDER — OXYCODONE HCL 5 MG/5ML PO SOLN: 5.0000 mg | Freq: Once | ORAL | Status: DC | PRN

## 2024-01-10 MED ORDER — DIPHENHYDRAMINE HCL 12.5 MG/5ML PO ELIX: 12.5000 mg | ORAL_SOLUTION | Freq: Four times a day (QID) | ORAL | Status: DC | PRN

## 2024-01-10 MED ORDER — SODIUM CHLORIDE 0.9 % IV SOLN: 250.0000 mL | INTRAVENOUS | Status: AC | PRN

## 2024-01-10 MED ORDER — CHLORHEXIDINE GLUCONATE 0.12 % MT SOLN
15.0000 mL | Freq: Once | OROMUCOSAL | Status: AC
  Administered 2024-01-10: 15 mL via OROMUCOSAL

## 2024-01-10 MED ORDER — PROPOFOL 10 MG/ML IV BOLUS
INTRAVENOUS | Status: AC
  Filled 2024-01-10: qty 20

## 2024-01-10 MED ORDER — CIPROFLOXACIN IN D5W 400 MG/200ML IV SOLN
400.0000 mg | INTRAVENOUS | Status: AC
  Administered 2024-01-10: 400 mg via INTRAVENOUS
  Filled 2024-01-10: qty 200

## 2024-01-10 MED ORDER — LIDOCAINE HCL (PF) 2 % IJ SOLN
INTRAMUSCULAR | Status: DC | PRN
Start: 2024-01-10 — End: 2024-01-10

## 2024-01-10 MED ORDER — SUGAMMADEX SODIUM 200 MG/2ML IV SOLN
INTRAVENOUS | Status: DC | PRN
Start: 2024-01-10 — End: 2024-01-10
  Administered 2024-01-10: 200 mg via INTRAVENOUS

## 2024-01-10 SURGICAL SUPPLY — 67 items
BAG COUNTER SPONGE SURGICOUNT (BAG) IMPLANT
BAG URINE DRAIN 2000ML AR STRL (UROLOGICAL SUPPLIES) IMPLANT
BAG URO CATCHER STRL LF (MISCELLANEOUS) IMPLANT
BASKET LASER NITINOL 1.9FR (BASKET) IMPLANT
BASKET ZERO TIP NITINOL 2.4FR (BASKET) IMPLANT
BENZOIN TINCTURE PRP APPL 2/3 (GAUZE/BANDAGES/DRESSINGS) ×1 IMPLANT
BLADE SURG 15 STRL LF DISP TIS (BLADE) ×1 IMPLANT
CATH FOLEY 2W COUNCIL 20FR 5CC (CATHETERS) IMPLANT
CATH FOLEY 2WAY SLVR 5CC 16FR (CATHETERS) ×1 IMPLANT
CATH MULTI PURPOSE 16FR DRAIN (CATHETERS) IMPLANT
CATH ROBINSON RED A/P 14FR (CATHETERS) ×1 IMPLANT
CATH ROBINSON RED A/P 20FR (CATHETERS) IMPLANT
CATH ULTRATHANE 14FR (CATHETERS) IMPLANT
CATH URETERAL DUAL LUMEN 10F (MISCELLANEOUS) ×1 IMPLANT
CATH URETL OPEN 5X70 (CATHETERS) ×1 IMPLANT
CATH URETL OPEN END 6FR 70 (CATHETERS) IMPLANT
CATH UROLOGY TORQUE 65 (CATHETERS) IMPLANT
CATH X-FORCE N30 NEPHROSTOMY (TUBING) IMPLANT
CHLORAPREP W/TINT 26 (MISCELLANEOUS) ×2 IMPLANT
COVER BACK TABLE 60X90IN (DRAPES) ×1 IMPLANT
DRAPE C-ARM 42X120 X-RAY (DRAPES) ×1 IMPLANT
DRAPE LINGEMAN PERC (DRAPES) ×1 IMPLANT
DRAPE SHEET LG 3/4 BI-LAMINATE (DRAPES) IMPLANT
DRAPE SURG IRRIG POUCH 19X23 (DRAPES) ×1 IMPLANT
DRSG TEGADERM 4X4.75 (GAUZE/BANDAGES/DRESSINGS) IMPLANT
DRSG TEGADERM 8X12 (GAUZE/BANDAGES/DRESSINGS) ×2 IMPLANT
EXTRACTOR STONE PERC NCIRCLE (MISCELLANEOUS) IMPLANT
GAUZE PAD ABD 8X10 STRL (GAUZE/BANDAGES/DRESSINGS) ×2 IMPLANT
GAUZE SPONGE 4X4 12PLY STRL (GAUZE/BANDAGES/DRESSINGS) IMPLANT
GLOVE BIOGEL M 7.0 STRL (GLOVE) ×1 IMPLANT
GOWN STRL REUS W/ TWL XL LVL3 (GOWN DISPOSABLE) ×1 IMPLANT
GUIDEWIRE AMPLAZ .035X145 (WIRE) IMPLANT
GUIDEWIRE STR DUAL SENSOR (WIRE) ×3 IMPLANT
GUIDEWIRE SUPER STIFF (WIRE) ×1 IMPLANT
GUIDEWIRE ZIPWRE .038 STRAIGHT (WIRE) ×1 IMPLANT
IV SET EXTENSION CATH 6 NF (IV SETS) IMPLANT
KIT BASIN OR (CUSTOM PROCEDURE TRAY) ×1 IMPLANT
KIT PROBE 340X3.4XDISP GRN (MISCELLANEOUS) IMPLANT
KIT PROBE TRILOGY 3.9X350 (MISCELLANEOUS) ×1 IMPLANT
KIT TURNOVER KIT A (KITS) ×1 IMPLANT
LEGGING LITHOTOMY PAIR STRL (DRAPES) ×1 IMPLANT
LUBRICANT JELLY K Y 4OZ (MISCELLANEOUS) ×1 IMPLANT
MANIFOLD NEPTUNE II (INSTRUMENTS) ×1 IMPLANT
NDL TROCAR 18X15 ECHO (NEEDLE) IMPLANT
NDL TROCAR 18X20 (NEEDLE) IMPLANT
NEEDLE TROCAR 18X15 ECHO (NEEDLE) IMPLANT
NEEDLE TROCAR 18X20 (NEEDLE) IMPLANT
NS IRRIG 1000ML POUR BTL (IV SOLUTION) ×1 IMPLANT
PACK CYSTO (CUSTOM PROCEDURE TRAY) IMPLANT
SHEATH PEELAWAY SET 9 (SHEATH) IMPLANT
SPONGE T-LAP 4X18 ~~LOC~~+RFID (SPONGE) ×1 IMPLANT
SURGIFLO W/THROMBIN 8M KIT (HEMOSTASIS) IMPLANT
SUT MNCRL AB 4-0 PS2 18 (SUTURE) ×1 IMPLANT
SUT SILK 0 FSL (SUTURE) IMPLANT
SUT VIC AB 4-0 PS2 18 (SUTURE) IMPLANT
SYR 10ML LL (SYRINGE) ×1 IMPLANT
SYR 20ML LL LF (SYRINGE) ×2 IMPLANT
SYR 50ML LL SCALE MARK (SYRINGE) ×1 IMPLANT
TOWEL OR 17X26 10 PK STRL BLUE (TOWEL DISPOSABLE) ×1 IMPLANT
TRACTIP FLEXIVA PULS ID 200XHI (Laser) IMPLANT
TRAY FOLEY MTR SLVR 16FR STAT (SET/KITS/TRAYS/PACK) ×1 IMPLANT
TUBE CONNECTING VINYL 14FR 30C (TUBING) IMPLANT
TUBING CONNECTING 10 (TUBING) ×1 IMPLANT
TUBING STONE CATCHER TRILOGY (MISCELLANEOUS) ×1 IMPLANT
TUBING UROLOGY SET (TUBING) IMPLANT
WATER STERILE IRR 1000ML POUR (IV SOLUTION) ×1 IMPLANT
WATER STERILE IRR 3000ML UROMA (IV SOLUTION) ×1 IMPLANT

## 2024-01-10 NOTE — Op Note (Signed)
 Operative Note  Preoperative diagnosis:  1.  Right renal stones  Postoperative diagnosis: 1.  Right renal stones  Procedure(s): 1. Right percutaneous nephrolithotomy (greater than 2 cm) 2. Dilation of right renal percutaneous tract under fluoroscopy 3. Right antegrade ureteroscopy with basket stone extraction 4. Right antegrade nephrostogram 5. Fluoroscopy time less than 1 hour with interpretation 6. Right ureteral stent placement 7. Right percutaneous nephrostomy tube placement  Surgeon: Doy Gene, MD  Assistants:  None  Anesthesia:  General  Complications:  None  EBL:  25ml  Specimens: 1. Stones for stone analysis (At [facility])  Drains/Catheters: 1.   A 16-French Foley. 2.   Right 14-French nephrostomy tube 3. 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 Removal of about 85% of total stone burden. Excellent access in right lower pole. No significant bleeding.  Indication:  Dustin Todd is a 58 y.o. male with a history of urolithiasis. Their stone burden included a staghorn stone filling right lower pole and renal pelvis and proximal ureter. After a thorough discussion of the risks, benefits, and alternatives of the surgery, pt agreed to proceed.  In the morning of surgery, pt went down to Interventional Radiology to get an access placed and nephroscopy films were reviewed prior to surgery today. They had good access with a 4-French nephroureteral catheter going all the way down to the bladder.  Description of procedure: After informed consent was obtained from the patient, the patient was identified, the patient was brought to the operating room and placed in supine position.   General anesthesia was administered as well as perioperative IV antibiotics with cipro .  At the beginning of the case, a time-out was performed to properly identify the patient, the surgery to be  performed, and the surgical site. Sequential compression devices were applied to lower extremities at the beginning of the case for DVT prophylaxis.   The patient was then placed into a prone position onto gel rolls, making sure that all pressure points were properly padded.  The patient's right flank and genitalia were then prepped and draped in sterile fashion. We placed a Foley sterilely in the patient in prone position.  A superstiff wire was passed through the 4-French open-ended catheter down to the bladder, which we could see under fluoroscopy. We made an incision around this wire. We then dilated the tract using a dual lumen. An antegrade nephrostogram was performed which demonstrated large staghorn stone burden. We then passed a 0.038 Sensor wire down to the bladder.   The Bard X force balloon was then passed over the superstiff wire until the radiopaque tip was well into the lower pole calyx.  We dilated this balloon to 18 cm of water  pressure.  We advanced the sheath over the balloon.  We deflated the balloon, leaving the wire in place.   The rigid nephroscope and the Trilogy were inserted into the kidney. The stone burden was encountered. The Trilogy was used to fragement and suction out the stone. The Perc N-circle was used to remove large fragments. 85% of total fragments were removed. There were several smaller stones lining the ureter and upper pole remaining.  I passed a 56 French nephrostomy tube and coiled this in the renal pelvis. I also passed a 6 french open ended ureteral catheter all the way to the bladder.  The sheath was removed. There was no significant bleeding. A repeat antegrade nephrostogram was  performed demonstrating no filling defects and no extravasation of contrast.    We then sutured the nephrostomy tube into position using a couple of 0 silk sutures.  The incision was closed with a 2-0 vicryl suture. Sterile 4x4's and ABD pad were placed around the nephrostomy  tube.  Several large OpSite dressings were placed over this.    At this point, the procedure was completed. He was then transferred to the supine position and recovery room in stable condition. The patient tolerated the procedure well.  There were no immediate complications.  Plan:  Patient will be observed overnight and will stay here through the weekend for second look PCNL on Monday. Keep nephrostomy tube to gravity. Ok for void trial over the weekend if urine not too dark.  Matt R. Shaughnessy Gethers MD [facility]  Pager: [phone]

## 2024-01-10 NOTE — Anesthesia Postprocedure Evaluation (Signed)
 Anesthesia Post Note  Patient: Dustin Todd  Procedure(s) Performed: NEPHROLITHOTOMY PERCUTANEOUS (Right)     Patient location during evaluation: PACU Anesthesia Type: General Level of consciousness: awake and alert and oriented Pain management: pain level controlled Vital Signs Assessment: post-procedure vital signs reviewed and stable Respiratory status: spontaneous breathing, nonlabored ventilation and respiratory function stable Cardiovascular status: blood pressure returned to baseline and stable Postop Assessment: no apparent nausea or vomiting Anesthetic complications: no   No notable events documented.  Last Vitals:  Vitals:   01/10/24 1548 01/10/24 1600  BP:  (!) 147/103  Pulse: 85 74  Resp: 15 17  Temp:    SpO2: 100% 100%    Last Pain:  Vitals:   01/10/24 1600  TempSrc:   PainSc: 2                  Lorilyn Laitinen A.

## 2024-01-10 NOTE — H&P (Signed)
 Office Visit Report     12/30/2023   --------------------------------------------------------------------------------   Dustin Todd  MRN: [id number]  DOB: 1966/01/24, 58 year old Male  SSN: [id number]   PRIMARY CARE:  Tretha Fu, NP  PRIMARY CARE FAX:  [phone]  REFERRING:  Tretha Fu, NP  PROVIDER:  Doy Gene, M.D.  TREATING:  Williemae Harsh, NP  LOCATION:  [facility] [phone]     --------------------------------------------------------------------------------   CC/HPI: Dustin Todd is a 58 year old male who is seen in consultation today for urolithiasis.   #1. Distal left ureteral stone:  -He presented to the hospital in 10/2023 with complaints of lower abdominal pain. CT A/P 11/17/2023 revealed 2 distal left ureteral stones lodged just above the left UPJ measuring 7 and 11 mm resulting in moderate left sided hydronephrosis. He was also found to have a 7 mm stone in the lower pole of the left kidney.  - He underwent left ureteral stent placement by Dr. Jarvis Mesa on 11/17/2023.  - He denies abdominal pain or flank pain.   #2. Staghorn right renal stone: CT 11/17/2023 also revealed staghorn stone of the right kidney with obstructing component. He underwent right nephrostomy tube placement by Dr. Jinx Mourning on 11/19/2023.   He denies abdominal pain or flank pain. Right nephrostomy tube has been draining clear yellow urine. He denies fevers or chills.   Of note, he has prior history of urolithiasis requiring ESWL in the past in 2001. He also has history of passing multiple stones. He has family history of urolithiasis.   He has a past medical history of anxiety. He has no prior abdominal surgeries.   He works as an Architect.   12/30/2023: Patient underwent successful left ureteroscopy on 5/16 with Dr. Freddi Jaeger. Prior to that procedure on 5/9 interventional radiology was successfully able to convert his right nephrostomy tube to a right nephroureteral catheter  which was capped prior to patient being discharged home. He was instructed on how to flush the catheter with saline on a routine basis. With the conclusion of left ureteroscopy a tethered ureteral stent was left in the patient has since subsequently removed that at home without any noted complication. He is now scheduled for staged right PCNL on the 13th and 16th of this month with an inpatient stay planned for between the surgeries. Presents today for follow-up evaluation accompanied by his wife. Overall doing well. He has been flushing his nephroureteral catheter intermittently without any noted complication. He has had minimal right sided pain/discomfort. He has had some mild but tolerable increased frequency/urgency but has not had any significant dysuria or gross hematuria. He has passed what he describes as sand-like stone material occasionally. Denies any interval fevers or chills, nausea/vomiting.     ALLERGIES:  penicillin    MEDICATIONS: Azelastine  HCl 0.1 % Solution     GU PSH: No GU PSH      PSH Notes: No Surgical Problems   NON-GU PSH: Visit Complexity (formerly GPC1X) - 11/26/2023     GU PMH: Renal calculus - 11/26/2023 Ureteral calculus - 11/26/2023    NON-GU PMH: Encounter for vasectomy consultation, Encounter for vasectomy counseling - 2017 Anxiety, Anxiety - 2016 Encounter for general adult medical examination without abnormal findings, Encounter for preventive health examination - 2016 Personal history of other specified conditions, History of heartburn - 2016    FAMILY HISTORY: Kidney Stones - Runs In Family   SOCIAL HISTORY: No Social History     Notes: Caffeine use, Married, Occupation, Never  a smoker, Number of children, Alcohol use, Patient's father is still living, Patient's mother is still living   REVIEW OF SYSTEMS:    GU Review Male:   Patient reports frequent urination and hard to postpone urination. Patient denies burning/ pain with urination, get up at  night to urinate, leakage of urine, stream starts and stops, trouble starting your stream, have to strain to urinate , erection problems, and penile pain.  Gastrointestinal (Upper):   Patient denies nausea, vomiting, and indigestion/ heartburn.  Gastrointestinal (Lower):   Patient denies diarrhea and constipation.  Constitutional:   Patient denies fever, night sweats, weight loss, and fatigue.  Skin:   Patient denies skin rash/ lesion and itching.  Eyes:   Patient denies blurred vision and double vision.  Ears/ Nose/ Throat:   Patient denies sore throat and sinus problems.  Hematologic/Lymphatic:   Patient denies swollen glands and easy bruising.  Cardiovascular:   Patient denies leg swelling and chest pains.  Respiratory:   Patient denies shortness of breath and cough.  Endocrine:   Patient denies excessive thirst.  Musculoskeletal:   Patient denies back pain and joint pain.  Neurological:   Patient denies headaches and dizziness.  Psychologic:   Patient denies depression and anxiety.   VITAL SIGNS:      12/30/2023 02:20 PM  Weight 167 lb / 75.75 kg  Height 68 in / 172.72 cm  BP 107/67 mmHg  Pulse 66 /min  BMI 25.4 kg/m   MULTI-SYSTEM PHYSICAL EXAMINATION:    Constitutional: Well-nourished. No physical deformities. Normally developed. Good grooming.  Neck: Neck symmetrical, not swollen. Normal tracheal position.  Respiratory: No labored breathing, no use of accessory muscles.   Cardiovascular: Normal temperature, normal extremity pulses, no swelling, no varicosities.  Skin: No paleness, no jaundice, no cyanosis. No lesion, no ulcer, no rash.  Neurologic / Psychiatric: Oriented to time, oriented to place, oriented to person. No depression, no anxiety, no agitation.  Gastrointestinal: No mass, no tenderness, no rigidity, non obese abdomen. Right nephroureteral catheter noted, capped. Sutured in place. Site grossly unremarkable. No palpable CVA or flank tenderness.  Musculoskeletal:  Normal gait and station of head and neck.     Complexity of Data:  Source Of History:  Patient, Family/Caregiver, Medical Record Summary  Records Review:   Previous Doctor Records, Previous Hospital Records, Previous Patient Records  Urine Test Review:   Urinalysis, Urine Culture  X-Ray Review: C.T. Abdomen/Pelvis: Reviewed Films. Reviewed Report.     12/30/23  Urinalysis  Urine Appearance Clear   Urine Color Yellow   Urine Glucose Neg mg/dL  Urine Bilirubin Neg mg/dL  Urine Ketones Neg mg/dL  Urine Specific Gravity 1.010   Urine Blood 2+ ery/uL  Urine pH 7.5   Urine Protein 1+ mg/dL  Urine Urobilinogen 0.2 mg/dL  Urine Nitrites Neg   Urine Leukocyte Esterase 1+ leu/uL  Urine WBC/hpf 0 - 5/hpf   Urine RBC/hpf 3 - 10/hpf   Urine Epithelial Cells NS (Not Seen)   Urine Bacteria Few (10-25/hpf)   Urine Mucous Present   Urine Yeast NS (Not Seen)   Urine Trichomonas Not Present   Urine Cystals NS (Not Seen)   Urine Casts NS (Not Seen)   Urine Sperm Not Present    PROCEDURES:          Urinalysis w/Scope Dipstick Dipstick Cont'd Micro  Color: Yellow Bilirubin: Neg mg/dL WBC/hpf: 0 - 5/hpf  Appearance: Clear Ketones: Neg mg/dL RBC/hpf: 3 - 16/XWR  Specific Gravity: 1.010 Blood: 2+  ery/uL Bacteria: Few (10-25/hpf)  pH: 7.5 Protein: 1+ mg/dL Cystals: NS (Not Seen)  Glucose: Neg mg/dL Urobilinogen: 0.2 mg/dL Casts: NS (Not Seen)    Nitrites: Neg Trichomonas: Not Present    Leukocyte Esterase: 1+ leu/uL Mucous: Present      Epithelial Cells: NS (Not Seen)      Yeast: NS (Not Seen)      Sperm: Not Present    ASSESSMENT:      ICD-10 Details  1 GU:   Renal and ureteral calculus - N20.2 Chronic, Threat to Bodily Function   PLAN:           Orders Labs Urine Culture          Schedule Return Visit/Planned Activity: Keep Scheduled Appointment - Follow up MD, Schedule Surgery          Document Letter(s):  Created for Patient: Clinical Summary         Notes:   Patient  doing well, tolerating his capped Nephro-ureteral stent appropriately. Moving forward, all questions answered to the best my ability regarding the upcoming. Postoperative course of staged right PCNL procedure to the best my ability with understanding expressed by the patient and his wife. Precautionary culture sent today to serve as a baseline. He will proceed with previously scheduled right staged PCNL on 6/13 with Dr. Freddi Jaeger.        Next Appointment:      Next Appointment: 01/10/2024 12:00 PM    Appointment Type: Surgery     Location: [facility] [phone]    Provider: Doy Gene, M.D.    Reason for Visit: [facility]/OBS 1ST STAGE (R) PCNL, (R) URS, HLL, (R) STENT    Urology Preoperative H&P   Chief Complaint: Right staghorn renal stone  History of Present Illness: Dustin Todd is a 58 y.o. male with a right staghorn renal stone here for first stage PCNL. Denies fever, chills, dysuria.    Past Medical History:  Diagnosis Date   Complication of anesthesia    after procedure 11/17/23- pt got home and passed out and had to be brought back ti ED - attributed to dehydration and anesthesia per pt no isses wuth 12/13/2023 procedure   History of kidney stones     Past Surgical History:  Procedure Laterality Date   CYSTOSCOPY W/ RETROGRADES Left 12/13/2023   Procedure: CYSTOSCOPY, WITH RETROGRADE PYELOGRAM;  Surgeon: Lahoma Pigg, MD;  Location: [facility];  Service: Urology;  Laterality: Left;   CYSTOSCOPY WITH STENT PLACEMENT Left 11/17/2023   Procedure: CYSTOSCOPY, WITH STENT INSERTION;  Surgeon: Mallie Seal, MD;  Location: [facility];  Service: Urology;  Laterality: Left;   CYSTOSCOPY/URETEROSCOPY/HOLMIUM LASER/STENT PLACEMENT Left 12/13/2023   Procedure: CYSTOSCOPY/URETEROSCOPY/HOLMIUM LASER/STENT PLACEMENT;  Surgeon: Lahoma Pigg, MD;  Location: [facility];  Service: Urology;  Laterality: Left;   IR CONVERT RIGHT NEPHROSTOMY TO NEPHROURETERAL CATH  12/06/2023   IR NEPHROSTOMY  PLACEMENT RIGHT  11/19/2023   LITHOTRIPSY      Allergies:  Allergies  Allergen Reactions   Gluten Meal Other (See Comments)    Break outs, stomach upset   Penicillins Rash    Family History  Problem Relation Age of Onset   Healthy Mother    Heart disease Father        pacemaker   Cancer Maternal Grandmother        Breast   Cancer Paternal Grandfather        Bone    Social History:  reports that  he has never smoked. He has never used smokeless tobacco. He reports that he does not currently use alcohol. He reports that he does not use drugs.  ROS: A complete review of systems was performed.  All systems are negative except for pertinent findings as noted.  Physical Exam:  Vital signs in last 24 hours:   Constitutional:  Alert and oriented, No acute distress Cardiovascular: Regular rate and rhythm Respiratory: Normal respiratory effort, Lungs clear bilaterally GI: Abdomen is soft, nontender, nondistended, no abdominal masses GU: No CVA tenderness Lymphatic: No lymphadenopathy Neurologic: Grossly intact, no focal deficits Psychiatric: Normal mood and affect  Laboratory Data:  No results for input(s): WBC, HGB, HCT, PLT in the last 72 hours.  No results for input(s): NA, K, CL, GLUCOSE, BUN, CALCIUM, CREATININE in the last 72 hours.  Invalid input(s): CO3   No results found for this or any previous visit (from the past 24 hours). No results found for this or any previous visit (from the past 240 hours).  Renal Function: No results for input(s): CREATININE in the last 168 hours. CrCl cannot be calculated (Patient's most recent lab result is older than the maximum 21 days allowed.).  Radiologic Imaging: No results found.  I independently reviewed the above imaging studies.  Assessment and Plan Dustin Todd is a 58 y.o. male with right staghorn renal stone here for first stage PCNL.     Dustin R. Harles Evetts MD 01/10/2024, 7:20 AM  [facility] Pager: [phone]): [phone]

## 2024-01-10 NOTE — Anesthesia Procedure Notes (Signed)
 Procedure Name: Intubation Date/Time: 01/10/2024 12:43 PM  Performed by: Virgil Griffiths, CRNAPre-anesthesia Checklist: Patient identified, Emergency Drugs available, Suction available and Patient being monitored Patient Re-evaluated:Patient Re-evaluated prior to induction Oxygen Delivery Method: Circle System Utilized Preoxygenation: Pre-oxygenation with 100% oxygen Induction Type: IV induction Ventilation: Mask ventilation without difficulty Laryngoscope Size: Miller and 2 Grade View: Grade I Tube type: Oral Tube size: 7.5 mm Number of attempts: 1 Airway Equipment and Method: Stylet and Oral airway Placement Confirmation: ETT inserted through vocal cords under direct vision, positive ETCO2 and breath sounds checked- equal and bilateral Secured at: 23 cm Tube secured with: Tape Dental Injury: Teeth and Oropharynx as per pre-operative assessment

## 2024-01-10 NOTE — Transfer of Care (Signed)
 Immediate Anesthesia Transfer of Care Note  Patient: Dustin Todd  Procedure(s) Performed: NEPHROLITHOTOMY PERCUTANEOUS (Right)  Patient Location: PACU  Anesthesia Type:General  Level of Consciousness: awake and patient cooperative  Airway & Oxygen Therapy: Patient Spontanous Breathing  Post-op Assessment: Report given to RN and Post -op Vital signs reviewed and stable  Post vital signs: Reviewed and stable  Last Vitals:  Vitals Value Taken Time  BP 150/93 01/10/24 14:55  Temp    Pulse 79 01/10/24 15:00  Resp 14 01/10/24 15:00  SpO2 98 % 01/10/24 15:00  Vitals shown include unfiled device data.  Last Pain:  Vitals:   01/10/24 1033  TempSrc:   PainSc: 0-No pain         Complications: No notable events documented.

## 2024-01-10 NOTE — Anesthesia Preprocedure Evaluation (Signed)
 Anesthesia Evaluation  Patient identified by MRN, date of birth, ID band Patient awake    Reviewed: Allergy & Precautions, NPO status , Patient's Chart, lab work & pertinent test results  History of Anesthesia Complications History of anesthetic complications: passed out at home after surgery last week.  Airway Mallampati: II  TM Distance: >3 FB Neck ROM: Full    Dental  (+) Dental Advisory Given   Pulmonary neg pulmonary ROS   breath sounds clear to auscultation       Cardiovascular negative cardio ROS  Rhythm:Regular Rate:Normal     Neuro/Psych  Headaches    GI/Hepatic negative GI ROS, Neg liver ROS,,,  Endo/Other  negative endocrine ROS    Renal/GU Renal InsufficiencyRenal diseaseRenal stone     Musculoskeletal   Abdominal   Peds  Hematology   Anesthesia Other Findings   Reproductive/Obstetrics                              Anesthesia Physical Anesthesia Plan  ASA: 2  Anesthesia Plan: General   Post-op Pain Management: Tylenol  PO (pre-op)*   Induction: Intravenous  PONV Risk Score and Plan: 2 and Ondansetron  and Dexamethasone   Airway Management Planned: LMA and Oral ETT  Additional Equipment: None  Intra-op Plan:   Post-operative Plan:   Informed Consent: I have reviewed the patients History and Physical, chart, labs and discussed the procedure including the risks, benefits and alternatives for the proposed anesthesia with the patient or authorized representative who has indicated his/her understanding and acceptance.     Dental advisory given  Plan Discussed with: CRNA and Surgeon  Anesthesia Plan Comments:          Anesthesia Quick Evaluation

## 2024-01-11 ENCOUNTER — Encounter (HOSPITAL_COMMUNITY): Payer: Self-pay | Admitting: Urology

## 2024-01-11 DIAGNOSIS — N132 Hydronephrosis with renal and ureteral calculous obstruction: Secondary | ICD-10-CM | POA: Diagnosis not present

## 2024-01-11 LAB — CBC
HCT: 38.6 % — ABNORMAL LOW (ref 39.0–52.0)
Hemoglobin: 12.9 g/dL — ABNORMAL LOW (ref 13.0–17.0)
MCH: 30.7 pg (ref 26.0–34.0)
MCHC: 33.4 g/dL (ref 30.0–36.0)
MCV: 91.9 fL (ref 80.0–100.0)
Platelets: 150 10*3/uL (ref 150–400)
RBC: 4.2 MIL/uL — ABNORMAL LOW (ref 4.22–5.81)
RDW: 13.2 % (ref 11.5–15.5)
WBC: 10.1 10*3/uL (ref 4.0–10.5)
nRBC: 0 % (ref 0.0–0.2)

## 2024-01-11 LAB — BASIC METABOLIC PANEL WITH GFR
Anion gap: 9 (ref 5–15)
BUN: 13 mg/dL (ref 6–20)
CO2: 23 mmol/L (ref 22–32)
Calcium: 8.9 mg/dL (ref 8.9–10.3)
Chloride: 102 mmol/L (ref 98–111)
Creatinine, Ser: 1.07 mg/dL (ref 0.61–1.24)
GFR, Estimated: >60 mL/min (ref >60)
Glucose, Bld: 109 mg/dL — ABNORMAL HIGH (ref 70–99)
Potassium: 3.9 mmol/L (ref 3.5–5.1)
Sodium: 134 mmol/L — ABNORMAL LOW (ref 135–145)

## 2024-01-11 NOTE — Progress Notes (Signed)
    Subjective: No complaints Tolerating diet No significant pain   Objective: Vital signs in last 24 hours: Temp:  [97.8 F (36.6 C)-98.7 F (37.1 C)] 98 F (36.7 C) (06/14 1238) Pulse Rate:  [55-89] 55 (06/14 1238) Resp:  [11-18] 16 (06/14 1238) BP: (119-166)/(75-131) 120/85 (06/14 1238) SpO2:  [96 %-100 %] 100 % (06/14 1238)  Intake/Output from previous day: 06/13 0701 - 06/14 0700 In: 1236 [P.O.:236; I.V.:900; IV Piggyback:100] Out: 2650 [Urine:2625; Blood:25] Intake/Output this shift: Total I/O In: 480 [P.O.:480] Out: 1000 [Urine:1000]   Physical Exam: Alert, NAD Abdomen soft Neph tube urine slightly more than pink-tinged but translucent Foley cath urine pink  Lab Results:  Recent Labs    01/10/24 1620 01/11/24 0508  WBC 7.5 10.1  HGB 14.2 12.9*  HCT 40.8 38.6*  PLT 142* 150   BMET Recent Labs    01/10/24 1620 01/11/24 0508  NA 137 134*  K 4.5 3.9  CL 104 102  CO2 25 23  GLUCOSE 120* 109*  BUN 10 13  CREATININE 1.17 1.07  CALCIUM 9.5 8.9     Assessment: Stable status post PCNL  Plan: Scheduled for second look PCNL 01/13/2024    Dustin Todd [facility] 01/11/2024

## 2024-01-11 NOTE — TOC Initial Note (Signed)
 Transition of Care [facility]) - Initial/Assessment Note    Patient Details  Name: Dustin Todd MRN: [id number] Date of Birth: 29-Jul-1966  Transition of Care [facility]) CM/SW Contact:    [name], RN Phone Number: 01/11/2024, 2:10 PM  Clinical Narrative:                 No PCP listed; spoke w/ pt and wife Dustin Todd [phone]) in room; pt says he plans to return at d/c; his wife will provide transportation; pt verified insurance; pt confirms he does not  have a PCP; he denied SDOH risks; pt says he does not have DME. HH services, or home oxygen; pt agreed to receive resources for [facility] PCPs; pt also encouraged to contact his insurance for list of in-network providers; pt will make appt w/ provider of choice; TOC is following.  Expected Discharge Plan: Home/Self Care Barriers to Discharge: Continued Medical Work up   Patient Goals and CMS Choice Patient states their goals for this hospitalization and ongoing recovery are:: home          Expected Discharge Plan and Services       Living arrangements for the past 2 months: Single Family Home                                      Prior Living Arrangements/Services Living arrangements for the past 2 months: Single Family Home Lives with:: Spouse Patient language and need for interpreter reviewed:: Yes Do you feel safe going back to the place where you live?: Yes      Need for Family Participation in Patient Care: Yes (Comment) Care giver support system in place?: Yes (comment) Current home services:  (n/a) Criminal Activity/Legal Involvement Pertinent to Current Situation/Hospitalization: No - Comment as needed  Activities of Daily Living   ADL Screening (condition at time of admission) Independently performs ADLs?: Yes (appropriate for developmental age) Is the patient deaf or have difficulty hearing?: No Does the patient have difficulty seeing, even when wearing glasses/contacts?: No Does the  patient have difficulty concentrating, remembering, or making decisions?: No  Permission Sought/Granted Permission sought to share information with : Case Manager Permission granted to share information with : Yes, Verbal Permission Granted  Share Information with NAME: Case Manager     Permission granted to share info w Relationship: [name] (wife) [phone]     Emotional Assessment Appearance:: Appears younger than stated age Attitude/Demeanor/Rapport: Gracious Affect (typically observed): Accepting Orientation: : Oriented to Self, Oriented to Place, Oriented to  Time, Oriented to Situation Alcohol / Substance Use: Not Applicable Psych Involvement: No (comment)  Admission diagnosis:  Renal stone [N20.0] Patient Active Problem List   Diagnosis Date Noted   Renal stone 01/10/2024   AKI (acute kidney injury) (HCC) 11/17/2023   Cluster headache 04/21/2015   Preventative health care 04/21/2015   Hyperlipidemia 04/21/2015   PCP:  Pcp, No Pharmacy:   [facility] [address], [place] - [address] [place] [address] Phone: [phone] Fax: [phone]  [place] [facility] [address] [place] [address] Phone: [phone] Fax: [phone]     Social Drivers of Health (SDOH) Social History: SDOH Screenings   Food Insecurity: No Food Insecurity (01/11/2024)  Housing: Low Risk  (01/11/2024)  Transportation Needs: No Transportation Needs (01/11/2024)  Utilities: Not At Risk (01/11/2024)  Tobacco Use: Low Risk  (  01/10/2024)   SDOH Interventions: Food Insecurity Interventions: Intervention Not Indicated, Inpatient TOC Housing Interventions: Intervention Not Indicated, Inpatient TOC Transportation Interventions: Intervention Not Indicated, Inpatient TOC Utilities Interventions: Intervention Not Indicated, Inpatient TOC   Readmission Risk Interventions     No data to display

## 2024-01-11 NOTE — Discharge Instructions (Signed)
 [facility] Primary Care Providers

## 2024-01-12 DIAGNOSIS — N132 Hydronephrosis with renal and ureteral calculous obstruction: Secondary | ICD-10-CM | POA: Diagnosis not present

## 2024-01-12 LAB — CBC
HCT: 39.1 % (ref 39.0–52.0)
Hemoglobin: 12.8 g/dL — ABNORMAL LOW (ref 13.0–17.0)
MCH: 30.5 pg (ref 26.0–34.0)
MCHC: 32.7 g/dL (ref 30.0–36.0)
MCV: 93.1 fL (ref 80.0–100.0)
Platelets: 135 10*3/uL — ABNORMAL LOW (ref 150–400)
RBC: 4.2 MIL/uL — ABNORMAL LOW (ref 4.22–5.81)
RDW: 13.7 % (ref 11.5–15.5)
WBC: 8.1 10*3/uL (ref 4.0–10.5)
nRBC: 0 % (ref 0.0–0.2)

## 2024-01-12 LAB — BASIC METABOLIC PANEL WITH GFR
Anion gap: 7 (ref 5–15)
BUN: 18 mg/dL (ref 6–20)
CO2: 24 mmol/L (ref 22–32)
Calcium: 9.1 mg/dL (ref 8.9–10.3)
Chloride: 104 mmol/L (ref 98–111)
Creatinine, Ser: 1.12 mg/dL (ref 0.61–1.24)
GFR, Estimated: >60 mL/min (ref >60)
Glucose, Bld: 91 mg/dL (ref 70–99)
Potassium: 3.8 mmol/L (ref 3.5–5.1)
Sodium: 135 mmol/L (ref 135–145)

## 2024-01-12 NOTE — Progress Notes (Signed)
    Subjective: Continues to do well No complaints No significant pain   Objective: Vital signs in last 24 hours: Temp:  [97.5 F (36.4 C)-99.6 F (37.6 C)] 98.4 F (36.9 C) (06/15 1209) Pulse Rate:  [53-66] 60 (06/15 1209) Resp:  [16-20] 20 (06/15 1209) BP: (126-129)/(73-88) 128/88 (06/15 1209) SpO2:  [99 %-100 %] 100 % (06/15 1209)  Intake/Output from previous day: 06/14 0701 - 06/15 0700 In: 720 [P.O.:720] Out: 2825 [Urine:2825] Intake/Output this shift: Total I/O In: -  Out: 1400 [Urine:1400]   Physical Exam: Alert, NAD Neph tube urine pink-tinged Foley cath urine clear  Lab Results:  Recent Labs    01/11/24 0508 01/12/24 0458  WBC 10.1 8.1  HGB 12.9* 12.8*  HCT 38.6* 39.1  PLT 150 135*   BMET Recent Labs    01/11/24 0508 01/12/24 0458  NA 134* 135  K 3.9 3.8  CL 102 104  CO2 23 24  GLUCOSE 109* 91  BUN 13 18  CREATININE 1.07 1.12  CALCIUM 8.9 9.1     Assessment: Stable status post PCNL  Plan: Scheduled for second look PCNL 01/13/2024 NPO after midnight    Dustin Todd 01/12/2024

## 2024-01-13 ENCOUNTER — Inpatient Hospital Stay (HOSPITAL_COMMUNITY): Admitting: Anesthesiology

## 2024-01-13 ENCOUNTER — Encounter (HOSPITAL_COMMUNITY): Payer: Self-pay | Admitting: Urology

## 2024-01-13 ENCOUNTER — Inpatient Hospital Stay (HOSPITAL_COMMUNITY)

## 2024-01-13 ENCOUNTER — Encounter (HOSPITAL_COMMUNITY)
Admission: AD | Disposition: A | Discharge status: Home / Self Care | Payer: Self-pay | Source: Home / Self Care | Attending: Urology

## 2024-01-13 ENCOUNTER — Other Ambulatory Visit: Payer: Self-pay

## 2024-01-13 ENCOUNTER — Ambulatory Visit (HOSPITAL_COMMUNITY): Admission: RE | Admit: 2024-01-13 | Source: Home / Self Care | Admitting: Urology

## 2024-01-13 DIAGNOSIS — N132 Hydronephrosis with renal and ureteral calculous obstruction: Secondary | ICD-10-CM | POA: Diagnosis not present

## 2024-01-13 HISTORY — PX: NEPHROLITHOTOMY: SHX5134

## 2024-01-13 LAB — BASIC METABOLIC PANEL WITH GFR
Anion gap: 7 (ref 5–15)
BUN: 19 mg/dL (ref 6–20)
CO2: 26 mmol/L (ref 22–32)
Calcium: 9 mg/dL (ref 8.9–10.3)
Chloride: 102 mmol/L (ref 98–111)
Creatinine, Ser: 1.17 mg/dL (ref 0.61–1.24)
GFR, Estimated: >60 mL/min (ref >60)
Glucose, Bld: 95 mg/dL (ref 70–99)
Potassium: 3.9 mmol/L (ref 3.5–5.1)
Sodium: 135 mmol/L (ref 135–145)

## 2024-01-13 LAB — CBC
HCT: 38.5 % — ABNORMAL LOW (ref 39.0–52.0)
Hemoglobin: 12.9 g/dL — ABNORMAL LOW (ref 13.0–17.0)
MCH: 30.6 pg (ref 26.0–34.0)
MCHC: 33.5 g/dL (ref 30.0–36.0)
MCV: 91.2 fL (ref 80.0–100.0)
Platelets: 141 10*3/uL — ABNORMAL LOW (ref 150–400)
RBC: 4.22 MIL/uL (ref 4.22–5.81)
RDW: 13.4 % (ref 11.5–15.5)
WBC: 7 10*3/uL (ref 4.0–10.5)
nRBC: 0 % (ref 0.0–0.2)

## 2024-01-13 SURGERY — NEPHROLITHOTOMY PERCUTANEOUS
Anesthesia: General | Laterality: Right

## 2024-01-13 MED ORDER — 0.9 % SODIUM CHLORIDE (POUR BTL) OPTIME
TOPICAL | Status: DC | PRN
  Administered 2024-01-13: 1000 mL

## 2024-01-13 MED ORDER — LIDOCAINE HCL (CARDIAC) PF 100 MG/5ML IV SOSY
PREFILLED_SYRINGE | INTRAVENOUS | Status: DC | PRN
  Administered 2024-01-13: 100 mg via INTRAVENOUS

## 2024-01-13 MED ORDER — DEXAMETHASONE SODIUM PHOSPHATE 10 MG/ML IJ SOLN
INTRAMUSCULAR | Status: DC | PRN
  Administered 2024-01-13: 8 mg via INTRAVENOUS

## 2024-01-13 MED ORDER — PROPOFOL 10 MG/ML IV BOLUS
INTRAVENOUS | Status: AC
  Filled 2024-01-13: qty 20

## 2024-01-13 MED ORDER — ACETAMINOPHEN 500 MG PO TABS
ORAL_TABLET | ORAL | Status: AC
  Filled 2024-01-13: qty 2

## 2024-01-13 MED ORDER — IOHEXOL 300 MG/ML  SOLN
INTRAMUSCULAR | Status: DC | PRN
  Administered 2024-01-13: 23 mL

## 2024-01-13 MED ORDER — CIPROFLOXACIN IN D5W 400 MG/200ML IV SOLN
INTRAVENOUS | Status: AC
  Filled 2024-01-13: qty 200

## 2024-01-13 MED ORDER — PROPOFOL 1000 MG/100ML IV EMUL
INTRAVENOUS | Status: AC
  Filled 2024-01-13: qty 100

## 2024-01-13 MED ORDER — OXYCODONE HCL 5 MG/5ML PO SOLN: 5.0000 mg | Freq: Once | ORAL | Status: DC | PRN

## 2024-01-13 MED ORDER — CHLORHEXIDINE GLUCONATE 0.12 % MT SOLN: 15.0000 mL | Freq: Once | OROMUCOSAL | Status: AC

## 2024-01-13 MED ORDER — PHENYLEPHRINE 80 MCG/ML (10ML) SYRINGE FOR IV PUSH (FOR BLOOD PRESSURE SUPPORT)
PREFILLED_SYRINGE | INTRAVENOUS | Status: DC | PRN
Start: 2024-01-13 — End: 2024-01-13
  Administered 2024-01-13 (×2): 80 ug via INTRAVENOUS

## 2024-01-13 MED ORDER — LIDOCAINE HCL (PF) 2 % IJ SOLN
INTRAMUSCULAR | Status: AC
  Filled 2024-01-13: qty 5

## 2024-01-13 MED ORDER — SODIUM CHLORIDE 0.9 % IR SOLN
Status: DC | PRN
  Administered 2024-01-13: 6000 mL

## 2024-01-13 MED ORDER — OXYCODONE-ACETAMINOPHEN 5-325 MG PO TABS: 1.0000 | ORAL_TABLET | ORAL | 0 refills | Status: AC | PRN

## 2024-01-13 MED ORDER — ROCURONIUM BROMIDE 10 MG/ML (PF) SYRINGE
PREFILLED_SYRINGE | INTRAVENOUS | Status: AC
  Filled 2024-01-13: qty 10

## 2024-01-13 MED ORDER — EPHEDRINE 5 MG/ML INJ
INTRAVENOUS | Status: AC
  Filled 2024-01-13: qty 5

## 2024-01-13 MED ORDER — LACTATED RINGERS IV SOLN: INTRAVENOUS | Status: DC

## 2024-01-13 MED ORDER — ROCURONIUM BROMIDE 100 MG/10ML IV SOLN
INTRAVENOUS | Status: DC | PRN
  Administered 2024-01-13: 60 mg via INTRAVENOUS
  Administered 2024-01-13: 10 mg via INTRAVENOUS

## 2024-01-13 MED ORDER — CIPROFLOXACIN IN D5W 400 MG/200ML IV SOLN
400.0000 mg | Freq: Once | INTRAVENOUS | Status: AC
  Administered 2024-01-13: 400 mg via INTRAVENOUS

## 2024-01-13 MED ORDER — ONDANSETRON HCL 4 MG/2ML IJ SOLN
INTRAMUSCULAR | Status: AC
  Filled 2024-01-13: qty 2

## 2024-01-13 MED ORDER — FENTANYL CITRATE (PF) 100 MCG/2ML IJ SOLN
INTRAMUSCULAR | Status: AC
Start: 2024-01-13 — End: 2024-01-13
  Filled 2024-01-13: qty 2

## 2024-01-13 MED ORDER — FENTANYL CITRATE (PF) 100 MCG/2ML IJ SOLN
INTRAMUSCULAR | Status: DC | PRN
  Administered 2024-01-13: 25 ug via INTRAVENOUS
  Administered 2024-01-13 (×2): 50 ug via INTRAVENOUS

## 2024-01-13 MED ORDER — OXYCODONE HCL 5 MG PO TABS: 5.0000 mg | ORAL_TABLET | Freq: Once | ORAL | Status: DC | PRN

## 2024-01-13 MED ORDER — DROPERIDOL 2.5 MG/ML IJ SOLN: 0.6250 mg | Freq: Once | INTRAMUSCULAR | Status: DC | PRN

## 2024-01-13 MED ORDER — PROPOFOL 10 MG/ML IV BOLUS
INTRAVENOUS | Status: DC | PRN
  Administered 2024-01-13: 100 ug/kg/min via INTRAVENOUS
  Administered 2024-01-13: 170 mg via INTRAVENOUS
  Administered 2024-01-13: 30 mg via INTRAVENOUS

## 2024-01-13 MED ORDER — MIDAZOLAM HCL 5 MG/5ML IJ SOLN
INTRAMUSCULAR | Status: DC | PRN
  Administered 2024-01-13: 2 mg via INTRAVENOUS

## 2024-01-13 MED ORDER — DEXAMETHASONE SODIUM PHOSPHATE 10 MG/ML IJ SOLN
INTRAMUSCULAR | Status: AC
  Filled 2024-01-13: qty 1

## 2024-01-13 MED ORDER — ACETAMINOPHEN 500 MG PO TABS: 1000.0000 mg | ORAL_TABLET | Freq: Once | ORAL | Status: AC

## 2024-01-13 MED ORDER — MIDAZOLAM HCL 2 MG/2ML IJ SOLN
INTRAMUSCULAR | Status: AC
  Filled 2024-01-13: qty 2

## 2024-01-13 MED ORDER — PHENYLEPHRINE HCL-NACL 20-0.9 MG/250ML-% IV SOLN
INTRAVENOUS | Status: AC
  Filled 2024-01-13: qty 250

## 2024-01-13 MED ORDER — PHENYLEPHRINE 80 MCG/ML (10ML) SYRINGE FOR IV PUSH (FOR BLOOD PRESSURE SUPPORT)
PREFILLED_SYRINGE | INTRAVENOUS | Status: AC
  Filled 2024-01-13: qty 10

## 2024-01-13 MED ORDER — FENTANYL CITRATE PF 50 MCG/ML IJ SOSY: 25.0000 ug | PREFILLED_SYRINGE | INTRAMUSCULAR | Status: DC | PRN

## 2024-01-13 SURGICAL SUPPLY — 69 items
BAG COUNTER SPONGE SURGICOUNT (BAG) IMPLANT
BAG URINE DRAIN 2000ML AR STRL (UROLOGICAL SUPPLIES) IMPLANT
BAG URO CATCHER STRL LF (MISCELLANEOUS) IMPLANT
BASKET LASER NITINOL 1.9FR (BASKET) IMPLANT
BASKET ZERO TIP NITINOL 2.4FR (BASKET) IMPLANT
BENZOIN TINCTURE PRP APPL 2/3 (GAUZE/BANDAGES/DRESSINGS) ×1 IMPLANT
BLADE SURG 15 STRL LF DISP TIS (BLADE) ×1 IMPLANT
CATH FOLEY 2W COUNCIL 20FR 5CC (CATHETERS) IMPLANT
CATH FOLEY 2W COUNCIL 5CC 16FR (CATHETERS) IMPLANT
CATH FOLEY 2WAY SLVR 5CC 16FR (CATHETERS) ×1 IMPLANT
CATH MULTI PURPOSE 16FR DRAIN (CATHETERS) IMPLANT
CATH ROBINSON RED A/P 14FR (CATHETERS) ×1 IMPLANT
CATH ROBINSON RED A/P 20FR (CATHETERS) IMPLANT
CATH ULTRATHANE 14FR (CATHETERS) IMPLANT
CATH URETERAL DUAL LUMEN 10F (MISCELLANEOUS) ×1 IMPLANT
CATH URETL OPEN 5X70 (CATHETERS) ×1 IMPLANT
CATH URETL OPEN END 6FR 70 (CATHETERS) IMPLANT
CATH X-FORCE N30 NEPHROSTOMY (TUBING) IMPLANT
CHLORAPREP W/TINT 26 (MISCELLANEOUS) ×2 IMPLANT
COVER BACK TABLE 60X90IN (DRAPES) ×1 IMPLANT
DRAPE C-ARM 42X120 X-RAY (DRAPES) ×1 IMPLANT
DRAPE LINGEMAN PERC (DRAPES) ×1 IMPLANT
DRAPE SHEET LG 3/4 BI-LAMINATE (DRAPES) IMPLANT
DRAPE SURG IRRIG POUCH 19X23 (DRAPES) ×1 IMPLANT
DRSG TEGADERM 4X4.75 (GAUZE/BANDAGES/DRESSINGS) IMPLANT
DRSG TEGADERM 6X8 (GAUZE/BANDAGES/DRESSINGS) IMPLANT
DRSG TEGADERM 8X12 (GAUZE/BANDAGES/DRESSINGS) ×2 IMPLANT
EXTRACTOR STONE PERC NCIRCLE (MISCELLANEOUS) IMPLANT
GAUZE PAD ABD 8X10 STRL (GAUZE/BANDAGES/DRESSINGS) ×2 IMPLANT
GAUZE SPONGE 4X4 12PLY STRL (GAUZE/BANDAGES/DRESSINGS) IMPLANT
GLOVE BIOGEL M 7.0 STRL (GLOVE) ×1 IMPLANT
GOWN STRL REUS W/ TWL XL LVL3 (GOWN DISPOSABLE) ×1 IMPLANT
GUIDEWIRE AMPLAZ .035X145 (WIRE) IMPLANT
GUIDEWIRE STR DUAL SENSOR (WIRE) ×3 IMPLANT
GUIDEWIRE SUPER STIFF (WIRE) ×1 IMPLANT
GUIDEWIRE ZIPWRE .038 STRAIGHT (WIRE) ×1 IMPLANT
IV SET EXTENSION CATH 6 NF (IV SETS) IMPLANT
KIT BASIN OR (CUSTOM PROCEDURE TRAY) ×1 IMPLANT
KIT PROBE 340X3.4XDISP GRN (MISCELLANEOUS) IMPLANT
KIT PROBE TRILOGY 3.9X350 (MISCELLANEOUS) ×1 IMPLANT
KIT TURNOVER KIT A (KITS) ×2 IMPLANT
LEGGING LITHOTOMY PAIR STRL (DRAPES) ×1 IMPLANT
LUBRICANT JELLY K Y 4OZ (MISCELLANEOUS) ×1 IMPLANT
MANIFOLD NEPTUNE II (INSTRUMENTS) ×1 IMPLANT
NDL TROCAR 18X15 ECHO (NEEDLE) IMPLANT
NDL TROCAR 18X20 (NEEDLE) IMPLANT
NEEDLE TROCAR 18X15 ECHO (NEEDLE) IMPLANT
NEEDLE TROCAR 18X20 (NEEDLE) IMPLANT
NS IRRIG 1000ML POUR BTL (IV SOLUTION) ×1 IMPLANT
PACK CYSTO (CUSTOM PROCEDURE TRAY) IMPLANT
SHEATH PEELAWAY SET 9 (SHEATH) IMPLANT
SPONGE T-LAP 4X18 ~~LOC~~+RFID (SPONGE) ×1 IMPLANT
STENT URET 6FRX26 CONTOUR (STENTS) IMPLANT
SURGIFLO W/THROMBIN 8M KIT (HEMOSTASIS) IMPLANT
SUT MNCRL AB 4-0 PS2 18 (SUTURE) ×1 IMPLANT
SUT SILK 0 FSL (SUTURE) IMPLANT
SUT VIC AB 4-0 PS2 18 (SUTURE) IMPLANT
SYR 10ML LL (SYRINGE) ×1 IMPLANT
SYR 20ML LL LF (SYRINGE) ×2 IMPLANT
SYR 50ML LL SCALE MARK (SYRINGE) ×1 IMPLANT
TOWEL OR 17X26 10 PK STRL BLUE (TOWEL DISPOSABLE) ×1 IMPLANT
TRACTIP FLEXIVA PULS ID 200XHI (Laser) IMPLANT
TRAY FOLEY MTR SLVR 16FR STAT (SET/KITS/TRAYS/PACK) ×1 IMPLANT
TUBE CONNECTING VINYL 14FR 30C (TUBING) IMPLANT
TUBING CONNECTING 10 (TUBING) ×1 IMPLANT
TUBING STONE CATCHER TRILOGY (MISCELLANEOUS) ×1 IMPLANT
TUBING UROLOGY SET (TUBING) IMPLANT
WATER STERILE IRR 1000ML POUR (IV SOLUTION) ×1 IMPLANT
WATER STERILE IRR 3000ML UROMA (IV SOLUTION) ×1 IMPLANT

## 2024-01-13 NOTE — H&P (View-Only) (Signed)
*   Day of Surgery * Subjective: Denies pain. No nausea or emesis.  Objective: Vital signs in last 24 hours: Temp:  [97.8 F (36.6 C)-98.5 F (36.9 C)] 98.5 F (36.9 C) (06/16 1241) Pulse Rate:  [62-74] 74 (06/16 1241) Resp:  [16-18] 16 (06/16 1241) BP: (120-144)/(76-85) 144/85 (06/16 1241) SpO2:  [100 %] 100 % (06/16 1241) Weight:  [76.2 kg] 76.2 kg (06/16 1230)  Intake/Output from previous day: 06/15 0701 - 06/16 0700 In: -  Out: 3150 [Urine:3150] Intake/Output this shift: Total I/O In: 0  Out: 475 [Urine:475]  Physical Exam:  General: Alert and oriented CV: RRR Lungs: Clear Abdomen: Soft, ND, NT; PCN and foley clear Ext: NT, No erythema  Lab Results: Recent Labs    01/11/24 0508 01/12/24 0458 01/13/24 0451  HGB 12.9* 12.8* 12.9*  HCT 38.6* 39.1 38.5*   BMET Recent Labs    01/12/24 0458 01/13/24 0451  NA 135 135  K 3.8 3.9  CL 104 102  CO2 24 26  GLUCOSE 91 95  BUN 18 19  CREATININE 1.12 1.17  CALCIUM 9.1 9.0     Studies/Results: No results found.  Assessment/Plan: Right renal stones  To OR today for 2nd look PCNL   LOS: 3 days   Matt R. Janiel Derhammer MD 01/13/2024, 1:25 PM [facility]  Pager: [phone]

## 2024-01-13 NOTE — Anesthesia Postprocedure Evaluation (Signed)
 Anesthesia Post Note  Patient: Treyvonne R Raffety  Procedure(s) Performed: NEPHROLITHOTOMY PERCUTANEOUS (Right)     Patient location during evaluation: PACU Anesthesia Type: General Level of consciousness: awake and alert Pain management: pain level controlled Vital Signs Assessment: post-procedure vital signs reviewed and stable Respiratory status: spontaneous breathing, nonlabored ventilation, respiratory function stable and patient connected to nasal cannula oxygen Cardiovascular status: blood pressure returned to baseline and stable Postop Assessment: no apparent nausea or vomiting Anesthetic complications: no  No notable events documented.  Last Vitals:  Vitals:   01/13/24 1600 01/13/24 1611  BP: (!) 146/87 (!) 150/87  Pulse: 78 82  Resp: 14 16  Temp:  (!) 36.4 C  SpO2: 96% 100%    Last Pain:  Vitals:   01/13/24 1611  TempSrc: Oral  PainSc:                  Huyen Perazzo D Bayden Gil

## 2024-01-13 NOTE — Progress Notes (Signed)
*   Day of Surgery * Subjective: Denies pain. No nausea or emesis.  Objective: Vital signs in last 24 hours: Temp:  [97.8 F (36.6 C)-98.5 F (36.9 C)] 98.5 F (36.9 C) (06/16 1241) Pulse Rate:  [62-74] 74 (06/16 1241) Resp:  [16-18] 16 (06/16 1241) BP: (120-144)/(76-85) 144/85 (06/16 1241) SpO2:  [100 %] 100 % (06/16 1241) Weight:  [76.2 kg] 76.2 kg (06/16 1230)  Intake/Output from previous day: 06/15 0701 - 06/16 0700 In: -  Out: 3150 [Urine:3150] Intake/Output this shift: Total I/O In: 0  Out: 475 [Urine:475]  Physical Exam:  General: Alert and oriented CV: RRR Lungs: Clear Abdomen: Soft, ND, NT; PCN and foley clear Ext: NT, No erythema  Lab Results: Recent Labs    01/11/24 0508 01/12/24 0458 01/13/24 0451  HGB 12.9* 12.8* 12.9*  HCT 38.6* 39.1 38.5*   BMET Recent Labs    01/12/24 0458 01/13/24 0451  NA 135 135  K 3.8 3.9  CL 104 102  CO2 24 26  GLUCOSE 91 95  BUN 18 19  CREATININE 1.12 1.17  CALCIUM 9.1 9.0     Studies/Results: No results found.  Assessment/Plan: Right renal stones  To OR today for 2nd look PCNL   LOS: 3 days   Matt R. Janiel Derhammer MD 01/13/2024, 1:25 PM [facility]  Pager: [phone]

## 2024-01-13 NOTE — Interval H&P Note (Signed)
 History and Physical Interval Note:  01/13/2024 1:26 PM  Dustin Todd  has presented today for surgery, with the diagnosis of RIGHT RENAL STONE.  The various methods of treatment have been discussed with the patient and family. After consideration of risks, benefits and other options for treatment, the patient has consented to  Procedure(s): NEPHROLITHOTOMY PERCUTANEOUS (Right) as a surgical intervention.  The patient's history has been reviewed, patient examined, no change in status, stable for surgery.  I have reviewed the patient's chart and labs.  Questions were answered to the patient's satisfaction.     Lahoma Pigg

## 2024-01-13 NOTE — Anesthesia Procedure Notes (Signed)
 Procedure Name: Intubation Date/Time: 01/13/2024 1:37 PM  Performed by: Carolynn Citrin, CRNAPre-anesthesia Checklist: Patient identified, Emergency Drugs available, Patient being monitored, Suction available and Timeout performed Patient Re-evaluated:Patient Re-evaluated prior to induction Oxygen Delivery Method: Circle system utilized Preoxygenation: Pre-oxygenation with 100% oxygen Induction Type: IV induction Ventilation: Mask ventilation without difficulty Laryngoscope Size: Mac and 4 Grade View: Grade I Tube type: Oral Tube size: 7.5 mm Number of attempts: 1 Airway Equipment and Method: Stylet Placement Confirmation: ETT inserted through vocal cords under direct vision, positive ETCO2 and breath sounds checked- equal and bilateral Secured at: 22 cm Tube secured with: Tape Dental Injury: Teeth and Oropharynx as per pre-operative assessment

## 2024-01-13 NOTE — Plan of Care (Incomplete)

## 2024-01-13 NOTE — Anesthesia Preprocedure Evaluation (Addendum)
 Anesthesia Evaluation  Patient identified by MRN, date of birth, ID band Patient awake    Reviewed: Allergy & Precautions, NPO status , Patient's Chart, lab work & pertinent test results  History of Anesthesia Complications Negative for: history of anesthetic complications  Airway Mallampati: II  TM Distance: >3 FB Neck ROM: Full    Dental  (+) Missing,    Pulmonary neg pulmonary ROS   Pulmonary exam normal        Cardiovascular negative cardio ROS Normal cardiovascular exam     Neuro/Psych  Headaches    GI/Hepatic negative GI ROS, Neg liver ROS,,,  Endo/Other  negative endocrine ROS    Renal/GU RIGHT RENAL STONE     Musculoskeletal negative musculoskeletal ROS (+)    Abdominal   Peds  Hematology  (+) Blood dyscrasia (Hgb 12.9, Plt 141k), anemia   Anesthesia Other Findings Day of surgery medications reviewed with patient.  Reproductive/Obstetrics                              Anesthesia Physical Anesthesia Plan  ASA: 2  Anesthesia Plan: General   Post-op Pain Management: Tylenol  PO (pre-op)*   Induction: Intravenous  PONV Risk Score and Plan: 2 and Treatment may vary due to age or medical condition, Ondansetron , Dexamethasone  and Midazolam   Airway Management Planned: Oral ETT  Additional Equipment: None  Intra-op Plan:   Post-operative Plan: Extubation in OR  Informed Consent: I have reviewed the patients History and Physical, chart, labs and discussed the procedure including the risks, benefits and alternatives for the proposed anesthesia with the patient or authorized representative who has indicated his/her understanding and acceptance.     Dental advisory given  Plan Discussed with: CRNA  Anesthesia Plan Comments:         Anesthesia Quick Evaluation

## 2024-01-13 NOTE — Transfer of Care (Signed)
 Immediate Anesthesia Transfer of Care Note  Patient: Dustin Todd  Procedure(s) Performed: NEPHROLITHOTOMY PERCUTANEOUS (Right)  Patient Location: PACU  Anesthesia Type:General  Level of Consciousness: awake, alert , oriented, and patient cooperative  Airway & Oxygen Therapy: Patient Spontanous Breathing and Patient connected to face mask oxygen  Post-op Assessment: Report given to RN and Post -op Vital signs reviewed and stable  Post vital signs: Reviewed and stable  Last Vitals:  Vitals Value Taken Time  BP 150/94 01/13/24 15:16  Temp 97   Pulse 93 01/13/24 15:18  Resp 8 01/13/24 15:18  SpO2 100 % 01/13/24 15:18  Vitals shown include unfiled device data.  Last Pain:  Vitals:   01/13/24 1241  TempSrc: Oral  PainSc:       Patients Stated Pain Goal: 0 (01/11/24 0805)  Complications: No notable events documented.

## 2024-01-13 NOTE — Op Note (Signed)
 Operative Note  Preoperative diagnosis:  1.  Right renal stones  Postoperative diagnosis: 1.  Right renal stones  Procedure(s): 1. Right percutaneous nephrolithotomy (less than 2 cm) 2. Right antegrade ureteroscopy with basket stone extraction 3. Right antegrade nephrostogram 4. Fluoroscopy time less than 1 hour with interpretation 5. Right ureteral stent placement 6. Right percutaneous nephrostomy tube placement  Surgeon: Doy Gene, MD  Assistants:  None  Anesthesia:  General  Complications:  None  EBL:  Minimal  Specimens: 1. Stones for stone analysis (AUS)  Drains/Catheters: 1.   A 16-French Foley. 2.   Right 16-French Councill tip nephrostomy tube 3. Right 6Fr by 26cm ureteral stent  Intraoperative findings:   Several stone fragments in the right lower pole, interpole and ureter that were all successfully removed.  Indication:  Dustin Todd is a 58 y.o. male with a history of urolithiasis. Their stone burden included a staghorn and he is here for second look PCNL. After a thorough discussion of the risks, benefits, and alternatives of the surgery, pt agreed to proceed.  He had good access with a 4-French nephroureteral catheter going all the way down to the bladder.  Description of procedure: After informed consent was obtained from the patient, the patient was identified, the patient was brought to the operating room and placed in supine position.   General anesthesia was administered as well as perioperative IV antibiotics with ciprofloxacin .  At the beginning of the case, a time-out was performed to properly identify the patient, the surgery to be performed, and the surgical site. Sequential compression devices were applied to lower extremities at the beginning of the case for DVT prophylaxis.   Pt had a foley catheter in place. The patient was then placed into a prone position onto gel rolls, making sure that all pressure points were properly padded.  The  patient's right flank and genitalia were then prepped and draped in sterile fashion.  A superstiff wire was passed through the 4-French open-ended catheter down to the bladder, which we could see under fluoroscopy. An antegrade nephrostogram was performed which demonstrated no hydronephrosis. We then passed a 0.038 Sensor wire down to the bladder.   I inserted a cystoscope into the kidney and removed all lower pole, interpole stones. Following this there were no remaining stone fragments. Flexible nephroscopy was performed revealing no further fragments. Repeat retrograde pyeloscopy was performed using the ureteroscope. No further stones were noted.   We advanced the ureteroscope all the way down into the bladder. I removed several ureteral stones with a basket. There were no stones identified along the course of the ureter. We then passed a 0.038 zip wire through the ureteroscope into the bladder.  A 57fr x 26cm stent was then placed in the standard fashion using fluoroscopic guidance.   We then advanced a 16-Fr council tip catheter over the superstiff wire into the renal pelvis. The sheath was removed. There was no significant bleeding. A repeat antegrade nephrostogram was performed demonstrating no filling defects and no extravasation of contrast.    We then sutured the Councill tip catheter into position using a couple of 0 silk sutures.  The incision was closed with a 2-0 vicryl suture. Sterile 4x4's and ABD pad were placed around the nephrostomy tube.  Several large OpSite dressings were placed over this.    At this point, the procedure was completed. He was then transferred to the supine position and recovery room in stable condition. The patient tolerated the procedure well.  There were no immediate complications.  Plan:  Patient will be observed overnight. CT A/P non-con will be obtained to assess remaining stone burden. We will plan to clamp the nephrostomy tube tomorrow and remove prior to  discharge tomorrow if no increased pain or fevers.  [name] MD [facility]  Pager: [phone]

## 2024-01-14 ENCOUNTER — Encounter (HOSPITAL_COMMUNITY): Payer: Self-pay | Admitting: Urology

## 2024-01-14 ENCOUNTER — Inpatient Hospital Stay (HOSPITAL_COMMUNITY)

## 2024-01-14 DIAGNOSIS — N132 Hydronephrosis with renal and ureteral calculous obstruction: Secondary | ICD-10-CM | POA: Diagnosis not present

## 2024-01-14 NOTE — Discharge Summary (Signed)
 Date of admission: 01/10/2024  Date of discharge: 01/14/2024  Admission diagnosis: Right renal stones  Discharge diagnosis: Right renal stones  Secondary diagnoses: None  History and Physical: For full details, please see admission history and physical. Briefly, Dustin Todd is a 58 y.o. year old patient with right renal stones who underwent staged PCNL on the right.   Hospital Course: The patient recovered in the usual expected fashion.  He had his diet advanced slowly.  Initially managed with IV pain control, then transitioned to PO meds when he was tolerating oral intake.  His labs were stable throughout the hospital course.  He was discharged to home on POD#4/1. His PCN and foley were removed.  At the time of discharge the patient was tolerating a regular diet, passing flatus, ambulating, had adequate pain control and was agreeable to discharge.  Follow up as scheduled.    Laboratory values:  Recent Labs    01/12/24 0458 01/13/24 0451  HGB 12.8* 12.9*  HCT 39.1 38.5*   Recent Labs    01/12/24 0458 01/13/24 0451  CREATININE 1.12 1.17    Disposition: Home  Discharge instruction: The patient was instructed to be ambulatory but told to refrain from heavy lifting, strenuous activity, or driving.   Discharge medications:  Allergies as of 01/14/2024       Reactions   Gluten Meal Other (See Comments)   Break outs, stomach upset   Penicillins Rash        Medication List     TAKE these medications    cetirizine  10 MG tablet Commonly known as: ZYRTEC  Take 1 tablet (10 mg total) by mouth daily. What changed:  when to take this reasons to take this   Normal Saline Flush 0.9 % Soln Use 5ml to flush right nephrostomy tube once daily as directed.   oxyCODONE -acetaminophen  5-325 MG tablet Commonly known as: Percocet Take 1 tablet by mouth every 4 (four) hours as needed for up to 18 doses for severe pain (pain score 7-10).   tamsulosin  0.4 MG Caps capsule Commonly  known as: FLOMAX  Take 0.4 mg by mouth.        Followup:   Follow-up Information     [facility] Follow up on 01/27/2024.   Why: 10:30AM Contact information: [address] [place]  [address] [phone]                Matt R. Montanna Mcbain MD [facility]  Pager: [phone]

## 2024-01-14 NOTE — Plan of Care (Signed)
